# Patient Record
Sex: Male | Born: 1995 | Race: White | Hispanic: No | Marital: Married | Smoking: Former smoker
Health system: Southern US, Community
[De-identification: ages and names within clinical notes are randomized; demographics above are authoritative.]

## PROBLEM LIST (undated history)

## (undated) DIAGNOSIS — I350 Nonrheumatic aortic (valve) stenosis: Secondary | ICD-10-CM

## (undated) DIAGNOSIS — Q249 Congenital malformation of heart, unspecified: Secondary | ICD-10-CM

## (undated) DIAGNOSIS — I7121 Aneurysm of the ascending aorta, without rupture: Secondary | ICD-10-CM

## (undated) DIAGNOSIS — J302 Other seasonal allergic rhinitis: Secondary | ICD-10-CM

## (undated) DIAGNOSIS — Q2381 Bicuspid aortic valve: Secondary | ICD-10-CM

## (undated) DIAGNOSIS — Q248 Other specified congenital malformations of heart: Secondary | ICD-10-CM

## (undated) DIAGNOSIS — Q238 Other congenital malformations of aortic and mitral valves: Secondary | ICD-10-CM

## (undated) HISTORY — PX: OTHER SURGICAL HISTORY: SHX169

## (undated) HISTORY — DX: Other congenital malformations of aortic and mitral valves: Q23.8

## (undated) HISTORY — DX: Bicuspid aortic valve: Q23.81

## (undated) HISTORY — DX: Congenital malformation of heart, unspecified: Q24.9

## (undated) HISTORY — DX: Other seasonal allergic rhinitis: J30.2

## (undated) HISTORY — DX: Other specified congenital malformations of heart: Q24.8

## (undated) HISTORY — DX: Aneurysm of the ascending aorta, without rupture: I71.21

---
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
## 2017-09-26 DIAGNOSIS — Q251 Coarctation of aorta: Secondary | ICD-10-CM | POA: Insufficient documentation

## 2018-12-06 ENCOUNTER — Encounter (HOSPITAL_COMMUNITY): Payer: Self-pay | Admitting: *Deleted

## 2018-12-06 ENCOUNTER — Emergency Department (HOSPITAL_COMMUNITY)
Admission: EM | Admit: 2018-12-06 | Discharge: 2018-12-06 | Disposition: A | Discharge status: Home / Self Care | Payer: TRICARE For Life (TFL) | Attending: Emergency Medicine | Admitting: Emergency Medicine

## 2018-12-06 ENCOUNTER — Inpatient Hospital Stay (HOSPITAL_COMMUNITY): Payer: TRICARE For Life (TFL) | Admitting: Anesthesiology

## 2018-12-06 ENCOUNTER — Observation Stay (HOSPITAL_COMMUNITY)
Admission: EM | Admit: 2018-12-06 | Discharge: 2018-12-07 | Disposition: A | Discharge status: Home / Self Care | Payer: TRICARE For Life (TFL) | Source: Ambulatory Visit | Attending: Orthopedic Surgery | Admitting: Orthopedic Surgery

## 2018-12-06 ENCOUNTER — Encounter (HOSPITAL_COMMUNITY): Payer: Self-pay

## 2018-12-06 ENCOUNTER — Emergency Department (HOSPITAL_COMMUNITY): Payer: TRICARE For Life (TFL)

## 2018-12-06 ENCOUNTER — Encounter (HOSPITAL_COMMUNITY)
Admission: EM | Disposition: A | Discharge status: Home / Self Care | Payer: Self-pay | Source: Ambulatory Visit | Attending: Orthopedic Surgery

## 2018-12-06 ENCOUNTER — Other Ambulatory Visit: Payer: Self-pay

## 2018-12-06 DIAGNOSIS — W540XXA Bitten by dog, initial encounter: Secondary | ICD-10-CM | POA: Insufficient documentation

## 2018-12-06 DIAGNOSIS — Z20828 Contact with and (suspected) exposure to other viral communicable diseases: Secondary | ICD-10-CM | POA: Insufficient documentation

## 2018-12-06 DIAGNOSIS — S62309B Unspecified fracture of unspecified metacarpal bone, initial encounter for open fracture: Secondary | ICD-10-CM

## 2018-12-06 DIAGNOSIS — S62305B Unspecified fracture of fourth metacarpal bone, left hand, initial encounter for open fracture: Principal | ICD-10-CM | POA: Insufficient documentation

## 2018-12-06 DIAGNOSIS — Y998 Other external cause status: Secondary | ICD-10-CM | POA: Insufficient documentation

## 2018-12-06 DIAGNOSIS — Y93K9 Activity, other involving animal care: Secondary | ICD-10-CM | POA: Insufficient documentation

## 2018-12-06 DIAGNOSIS — Z23 Encounter for immunization: Secondary | ICD-10-CM | POA: Insufficient documentation

## 2018-12-06 DIAGNOSIS — Z79899 Other long term (current) drug therapy: Secondary | ICD-10-CM | POA: Insufficient documentation

## 2018-12-06 DIAGNOSIS — Y92018 Other place in single-family (private) house as the place of occurrence of the external cause: Secondary | ICD-10-CM | POA: Insufficient documentation

## 2018-12-06 DIAGNOSIS — F17210 Nicotine dependence, cigarettes, uncomplicated: Secondary | ICD-10-CM | POA: Insufficient documentation

## 2018-12-06 DIAGNOSIS — I350 Nonrheumatic aortic (valve) stenosis: Secondary | ICD-10-CM | POA: Insufficient documentation

## 2018-12-06 HISTORY — PX: I & D EXTREMITY: SHX5045

## 2018-12-06 LAB — SARS CORONAVIRUS 2 BY RT PCR (HOSPITAL ORDER, PERFORMED IN ~~LOC~~ HOSPITAL LAB): SARS Coronavirus 2: NEGATIVE

## 2018-12-06 SURGERY — OPEN REDUCTION INTERNAL FIXATION (ORIF) METACARPAL
Anesthesia: General | Site: Finger | Laterality: Left

## 2018-12-06 SURGERY — IRRIGATION AND DEBRIDEMENT EXTREMITY
Anesthesia: Monitor Anesthesia Care | Site: Hand | Laterality: Left

## 2018-12-06 MED ORDER — OXYCODONE HCL 5 MG PO TABS
10.0000 mg | ORAL_TABLET | ORAL | Status: DC | PRN
  Administered 2018-12-06: 10 mg via ORAL
  Administered 2018-12-07 (×2): 15 mg via ORAL
  Filled 2018-12-06 (×2): qty 3

## 2018-12-06 MED ORDER — LACTATED RINGERS IV SOLN
INTRAVENOUS | Status: DC
  Administered 2018-12-06: 14:00:00 via INTRAVENOUS

## 2018-12-06 MED ORDER — ONDANSETRON HCL 4 MG PO TABS: 4.0000 mg | ORAL_TABLET | Freq: Four times a day (QID) | ORAL | Status: DC | PRN

## 2018-12-06 MED ORDER — ROPIVACAINE HCL 5 MG/ML IJ SOLN
INTRAMUSCULAR | Status: DC | PRN
  Administered 2018-12-06: 30 mL via PERINEURAL

## 2018-12-06 MED ORDER — PROPOFOL 10 MG/ML IV BOLUS
INTRAVENOUS | Status: AC
  Filled 2018-12-06: qty 20

## 2018-12-06 MED ORDER — METHOCARBAMOL 1000 MG/10ML IJ SOLN
500.0000 mg | Freq: Four times a day (QID) | INTRAVENOUS | Status: DC | PRN
  Filled 2018-12-06: qty 5

## 2018-12-06 MED ORDER — VANCOMYCIN HCL 1000 MG IV SOLR
INTRAVENOUS | Status: AC
  Filled 2018-12-06: qty 1000

## 2018-12-06 MED ORDER — LACTATED RINGERS IV SOLN
INTRAVENOUS | Status: DC
  Administered 2018-12-06 – 2018-12-07 (×2): via INTRAVENOUS

## 2018-12-06 MED ORDER — ALPRAZOLAM 0.5 MG PO TABS: 0.5000 mg | ORAL_TABLET | Freq: Four times a day (QID) | ORAL | Status: DC | PRN

## 2018-12-06 MED ORDER — MIDAZOLAM HCL 2 MG/2ML IJ SOLN
INTRAMUSCULAR | Status: DC | PRN
  Administered 2018-12-06: 2 mg via INTRAVENOUS

## 2018-12-06 MED ORDER — 0.9 % SODIUM CHLORIDE (POUR BTL) OPTIME
TOPICAL | Status: DC | PRN
  Administered 2018-12-06: 1000 mL

## 2018-12-06 MED ORDER — FENTANYL CITRATE (PF) 250 MCG/5ML IJ SOLN
INTRAMUSCULAR | Status: DC | PRN
  Administered 2018-12-06: 50 ug via INTRAVENOUS

## 2018-12-06 MED ORDER — MIDAZOLAM HCL 2 MG/2ML IJ SOLN
INTRAMUSCULAR | Status: AC
  Filled 2018-12-06: qty 2

## 2018-12-06 MED ORDER — FENTANYL CITRATE (PF) 100 MCG/2ML IJ SOLN: 25.0000 ug | INTRAMUSCULAR | Status: DC | PRN

## 2018-12-06 MED ORDER — PROMETHAZINE HCL 25 MG/ML IJ SOLN: 6.2500 mg | INTRAMUSCULAR | Status: DC | PRN

## 2018-12-06 MED ORDER — ACETAMINOPHEN 500 MG PO TABS
1000.0000 mg | ORAL_TABLET | Freq: Four times a day (QID) | ORAL | Status: AC
  Administered 2018-12-06 – 2018-12-07 (×4): 1000 mg via ORAL
  Filled 2018-12-06 (×4): qty 2

## 2018-12-06 MED ORDER — FENTANYL CITRATE (PF) 250 MCG/5ML IJ SOLN
INTRAMUSCULAR | Status: AC
  Filled 2018-12-06: qty 5

## 2018-12-06 MED ORDER — LIDOCAINE HCL (CARDIAC) PF 100 MG/5ML IV SOSY
PREFILLED_SYRINGE | INTRAVENOUS | Status: DC | PRN
  Administered 2018-12-06: 60 mg via INTRAVENOUS

## 2018-12-06 MED ORDER — OXYCODONE-ACETAMINOPHEN 5-325 MG PO TABS
1.0000 | ORAL_TABLET | Freq: Once | ORAL | Status: AC
  Administered 2018-12-06: 09:00:00 1 via ORAL
  Filled 2018-12-06: qty 1

## 2018-12-06 MED ORDER — ONDANSETRON HCL 4 MG/2ML IJ SOLN: 4.0000 mg | Freq: Four times a day (QID) | INTRAMUSCULAR | Status: DC | PRN

## 2018-12-06 MED ORDER — ACETAMINOPHEN 325 MG PO TABS: 325.0000 mg | ORAL_TABLET | Freq: Four times a day (QID) | ORAL | Status: DC | PRN

## 2018-12-06 MED ORDER — ONDANSETRON HCL 4 MG/2ML IJ SOLN
INTRAMUSCULAR | Status: DC | PRN
  Administered 2018-12-06: 4 mg via INTRAVENOUS

## 2018-12-06 MED ORDER — HYDROMORPHONE HCL 1 MG/ML IJ SOLN
0.5000 mg | INTRAMUSCULAR | Status: DC | PRN
  Administered 2018-12-06: 1 mg via INTRAVENOUS
  Filled 2018-12-06: qty 1

## 2018-12-06 MED ORDER — METHOCARBAMOL 500 MG PO TABS: 500.0000 mg | ORAL_TABLET | Freq: Four times a day (QID) | ORAL | Status: DC | PRN

## 2018-12-06 MED ORDER — PROMETHAZINE HCL 12.5 MG RE SUPP: 12.5000 mg | Freq: Four times a day (QID) | RECTAL | Status: DC | PRN

## 2018-12-06 MED ORDER — DOCUSATE SODIUM 100 MG PO CAPS
100.0000 mg | ORAL_CAPSULE | Freq: Two times a day (BID) | ORAL | Status: DC
  Administered 2018-12-06: 100 mg via ORAL
  Filled 2018-12-06 (×2): qty 1

## 2018-12-06 MED ORDER — LIDOCAINE-EPINEPHRINE (PF) 2 %-1:200000 IJ SOLN
10.0000 mL | Freq: Once | INTRAMUSCULAR | Status: AC
  Administered 2018-12-06: 10 mL via INTRADERMAL
  Filled 2018-12-06: qty 10

## 2018-12-06 MED ORDER — PROPOFOL 500 MG/50ML IV EMUL
INTRAVENOUS | Status: DC | PRN
  Administered 2018-12-06: 75 ug/kg/min via INTRAVENOUS

## 2018-12-06 MED ORDER — TETANUS-DIPHTH-ACELL PERTUSSIS 5-2.5-18.5 LF-MCG/0.5 IM SUSP
0.5000 mL | Freq: Once | INTRAMUSCULAR | Status: AC
  Administered 2018-12-06: 09:00:00 0.5 mL via INTRAMUSCULAR
  Filled 2018-12-06: qty 0.5

## 2018-12-06 MED ORDER — PROPOFOL 10 MG/ML IV BOLUS
INTRAVENOUS | Status: DC | PRN
  Administered 2018-12-06: 50 mg via INTRAVENOUS
  Administered 2018-12-06 (×3): 20 mg via INTRAVENOUS

## 2018-12-06 MED ORDER — SODIUM CHLORIDE 0.9 % IV SOLN
3.0000 g | Freq: Four times a day (QID) | INTRAVENOUS | Status: DC
  Administered 2018-12-06 – 2018-12-07 (×5): 3 g via INTRAVENOUS
  Filled 2018-12-06 (×2): qty 8
  Filled 2018-12-06: qty 3
  Filled 2018-12-06 (×2): qty 8
  Filled 2018-12-06 (×2): qty 3

## 2018-12-06 MED ORDER — POVIDONE-IODINE 10 % EX SOLN
CUTANEOUS | Status: DC | PRN
  Administered 2018-12-06: 09:00:00 via TOPICAL
  Filled 2018-12-06: qty 15

## 2018-12-06 MED ORDER — SODIUM CHLORIDE 0.9 % IR SOLN
Status: DC | PRN
  Administered 2018-12-06 (×2): 3000 mL

## 2018-12-06 MED ORDER — FAMOTIDINE 20 MG PO TABS: 20.0000 mg | ORAL_TABLET | Freq: Two times a day (BID) | ORAL | Status: DC | PRN

## 2018-12-06 MED ORDER — OXYCODONE HCL 5 MG PO TABS
5.0000 mg | ORAL_TABLET | ORAL | Status: DC | PRN
  Filled 2018-12-06: qty 2

## 2018-12-06 MED ORDER — LIDOCAINE 2% (20 MG/ML) 5 ML SYRINGE
INTRAMUSCULAR | Status: AC
  Filled 2018-12-06: qty 5

## 2018-12-06 MED ORDER — SODIUM CHLORIDE 0.9 % IV SOLN
3.0000 g | Freq: Four times a day (QID) | INTRAVENOUS | Status: DC
  Administered 2018-12-06: 11:00:00 3 g via INTRAVENOUS
  Filled 2018-12-06: qty 8

## 2018-12-06 MED ORDER — VITAMIN C 500 MG PO TABS
1000.0000 mg | ORAL_TABLET | Freq: Every day | ORAL | Status: DC
  Administered 2018-12-06 – 2018-12-07 (×2): 1000 mg via ORAL
  Filled 2018-12-06 (×2): qty 2

## 2018-12-06 SURGICAL SUPPLY — 39 items
BNDG CONFORM 2 STRL LF (GAUZE/BANDAGES/DRESSINGS) ×4 IMPLANT
BNDG CONFORM 3 STRL LF (GAUZE/BANDAGES/DRESSINGS) ×8 IMPLANT
BNDG ELASTIC 3X5.8 VLCR STR LF (GAUZE/BANDAGES/DRESSINGS) ×4 IMPLANT
BNDG ELASTIC 4X5.8 VLCR STR LF (GAUZE/BANDAGES/DRESSINGS) ×4 IMPLANT
BNDG ESMARK 4X9 LF (GAUZE/BANDAGES/DRESSINGS) ×4 IMPLANT
BNDG GAUZE ELAST 4 BULKY (GAUZE/BANDAGES/DRESSINGS) ×4 IMPLANT
COVER SURGICAL LIGHT HANDLE (MISCELLANEOUS) ×4 IMPLANT
CUFF TOURN SGL QUICK 18X4 (TOURNIQUET CUFF) ×4 IMPLANT
DRSG ADAPTIC 3X8 NADH LF (GAUZE/BANDAGES/DRESSINGS) ×4 IMPLANT
GAUZE SPONGE 4X4 12PLY STRL (GAUZE/BANDAGES/DRESSINGS) ×4 IMPLANT
GAUZE XEROFORM 5X9 LF (GAUZE/BANDAGES/DRESSINGS) ×4 IMPLANT
GLOVE SS BIOGEL STRL SZ 8 (GLOVE) ×4 IMPLANT
GLOVE SUPERSENSE BIOGEL SZ 8 (GLOVE) ×4
GOWN STRL REUS W/ TWL XL LVL3 (GOWN DISPOSABLE) ×4 IMPLANT
GOWN STRL REUS W/TWL XL LVL3 (GOWN DISPOSABLE) ×4
JET LAVAGE IRRISEPT WOUND (IRRIGATION / IRRIGATOR) ×4
KIT BASIN OR (CUSTOM PROCEDURE TRAY) ×4 IMPLANT
KIT TURNOVER KIT B (KITS) ×4 IMPLANT
LAVAGE JET IRRISEPT WOUND (IRRIGATION / IRRIGATOR) ×2 IMPLANT
MANIFOLD NEPTUNE II (INSTRUMENTS) ×4 IMPLANT
NS IRRIG 1000ML POUR BTL (IV SOLUTION) ×4 IMPLANT
PACK ORTHO EXTREMITY (CUSTOM PROCEDURE TRAY) ×4 IMPLANT
PAD ARMBOARD 7.5X6 YLW CONV (MISCELLANEOUS) ×8 IMPLANT
PAD CAST 3X4 CTTN HI CHSV (CAST SUPPLIES) ×2 IMPLANT
PAD CAST 4YDX4 CTTN HI CHSV (CAST SUPPLIES) ×2 IMPLANT
PADDING CAST COTTON 3X4 STRL (CAST SUPPLIES) ×2
PADDING CAST COTTON 4X4 STRL (CAST SUPPLIES) ×2
SET IRRIG Y TYPE TUR BLADDER L (SET/KITS/TRAYS/PACK) ×4 IMPLANT
SOL PREP POV-IOD 4OZ 10% (MISCELLANEOUS) ×8 IMPLANT
SPLINT PLASTER EXTRA FAST 3X15 (CAST SUPPLIES) ×4
SPLINT PLASTER GYPS XFAST 3X15 (CAST SUPPLIES) ×4 IMPLANT
SUCTION FRAZIER HANDLE 10FR (MISCELLANEOUS) ×2
SUCTION TUBE FRAZIER 10FR DISP (MISCELLANEOUS) ×2 IMPLANT
SUT PROLENE 4 0 PS 2 18 (SUTURE) ×4 IMPLANT
TOWEL GREEN STERILE (TOWEL DISPOSABLE) ×4 IMPLANT
TOWEL GREEN STERILE FF (TOWEL DISPOSABLE) ×4 IMPLANT
TUBE CONNECTING 12'X1/4 (SUCTIONS) ×1
TUBE CONNECTING 12X1/4 (SUCTIONS) ×3 IMPLANT
WATER STERILE IRR 1000ML POUR (IV SOLUTION) ×4 IMPLANT
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
## 2018-12-06 NOTE — Anesthesia Postprocedure Evaluation (Signed)
Anesthesia Post Note  Patient: Larry Campos  Procedure(s) Performed: IRRIGATION AND DEBRIDEMENT EXTREMITY (Left Hand)     Patient location during evaluation: PACU Anesthesia Type: Regional Level of consciousness: awake and alert Pain management: pain level controlled Vital Signs Assessment: post-procedure vital signs reviewed and stable Respiratory status: spontaneous breathing, nonlabored ventilation, respiratory function stable and patient connected to nasal cannula oxygen Cardiovascular status: stable and blood pressure returned to baseline Postop Assessment: no apparent nausea or vomiting Anesthetic complications: no    Last Vitals:  Vitals:   12/06/18 1823 12/06/18 1915  BP: 126/66 120/65  Pulse: 77 73  Resp: 16 16  Temp: 36.9 C 36.9 C  SpO2: 100% 97%    Last Pain:  Vitals:   12/06/18 2154  TempSrc:   PainSc: [place]

## 2018-12-06 NOTE — Discharge Instructions (Addendum)
Go directly to the ER at [facility] to register.  Nothing to eat or drink.

## 2018-12-06 NOTE — Transfer of Care (Signed)
Immediate Anesthesia Transfer of Care Note  Patient: Larry Campos  Procedure(s) Performed: OPEN REDUCTION INTERNAL FIXATION (ORIF) METACARPAL (Left Finger)  Patient Location: PACU  Anesthesia Type:MAC  Level of Consciousness: awake  Airway & Oxygen Therapy: Patient Spontanous Breathing and Patient connected to nasal cannula oxygen  Post-op Assessment: Report given to RN and Post -op Vital signs reviewed and stable  Post vital signs: Reviewed and stable  Last Vitals:  Vitals Value Taken Time  BP 116/70 12/06/18 1620  Temp    Pulse 60 12/06/18 1622  Resp 10 12/06/18 1622  SpO2 100 % 12/06/18 1622  Vitals shown include unvalidated device data.  Last Pain:  Vitals:   12/06/18 1337  TempSrc: Oral  PainSc: 4       Patients Stated Pain Goal: 4 (48/88/91 6945)  Complications: No apparent anesthesia complications

## 2018-12-06 NOTE — Anesthesia Procedure Notes (Signed)
Anesthesia Regional Block: Axillary brachial plexus block   Pre-Anesthetic Checklist: ,, timeout performed, Correct Patient, Correct Site, Correct Laterality, Correct Procedure, Correct Position, site marked, Risks and benefits discussed,  Surgical consent,  Pre-op evaluation,  At surgeon's request and post-op pain management  Laterality: Left  Prep: chloraprep       Needles:  Injection technique: Single-shot  Needle Type: Echogenic Needle     Needle Length: 9cm  Needle Gauge: 21     Additional Needles:   Procedures:,,,, ultrasound used (permanent image in chart),,,,  Narrative:  Start time: 12/06/2018 2:45 PM End time: 12/06/2018 2:55 PM Injection made incrementally with aspirations every 5 mL.  Performed by: Personally  Anesthesiologist: Catalina Gravel, MD  Additional Notes: No pain on injection. No increased resistance to injection. Injection made in 5cc increments.  Good needle visualization.  Patient tolerated procedure well.

## 2018-12-06 NOTE — ED Notes (Signed)
Dressed left hand with bulky dressing.
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
## 2018-12-06 NOTE — ED Notes (Signed)
Dr. Amedeo Plenty is in surgery.  Nurse will have him call Tammy when available.

## 2018-12-06 NOTE — ED Notes (Signed)
Pt bite by his dog, dog has had rabies shots.  Bleeding noted to left hand, dressing and pressure applied to area.

## 2018-12-06 NOTE — ED Notes (Signed)
Report Given to Noble, Therapist, sports.  Per Heath Lark, pt is to go directly from [facility] to [facility] ED registration and register for short stay.  Pt verbalized understanding.

## 2018-12-06 NOTE — ED Notes (Signed)
Animal control called, Officer to come out.
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
## 2018-12-06 NOTE — Anesthesia Preprocedure Evaluation (Addendum)
Anesthesia Evaluation  Patient identified by MRN, date of birth, ID band Patient awake    Reviewed: Allergy & Precautions, NPO status , Patient's Chart, lab work & pertinent test results  Airway Mallampati: II  TM Distance: >3 FB Neck ROM: Full    Dental  (+) Teeth Intact, Dental Advisory Given   Pulmonary Current SmokerPatient did not abstain from smoking.,    Pulmonary exam normal breath sounds clear to auscultation       Cardiovascular + Valvular Problems/Murmurs AS and AI  Rhythm:Regular Rate:Normal + Systolic murmurs and + Diastolic murmurs OSH Cardiology Note 2012: History of bicuspid AV, mild AS, mild-moderate AI. Aortic root was dilated.   Neuro/Psych negative neurological ROS     GI/Hepatic negative GI ROS, Neg liver ROS,   Endo/Other  negative endocrine ROS  Renal/GU negative Renal ROS     Musculoskeletal fracture of left 4th finger   Abdominal   Peds  Hematology negative hematology ROS (+)   Anesthesia Other Findings Day of surgery medications reviewed with the patient.  Reproductive/Obstetrics                            Anesthesia Physical Anesthesia Plan  ASA: II  Anesthesia Plan: Regional and MAC   Post-op Pain Management:    Induction: Intravenous  PONV Risk Score and Plan: 1 and Propofol infusion and Treatment may vary due to age or medical condition  Airway Management Planned: Nasal Cannula and Natural Airway  Additional Equipment:   Intra-op Plan:   Post-operative Plan:   Informed Consent: I have reviewed the patients History and Physical, chart, labs and discussed the procedure including the risks, benefits and alternatives for the proposed anesthesia with the patient or authorized representative who has indicated his/her understanding and acceptance.     Dental advisory given  Plan Discussed with:   Anesthesia Plan Comments:         Anesthesia  Quick Evaluation
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
## 2018-12-06 NOTE — ED Notes (Addendum)
Per Tammy, OK to wrap IV and leave in [facility].

## 2018-12-07 ENCOUNTER — Encounter (HOSPITAL_COMMUNITY): Payer: Self-pay | Admitting: Orthopedic Surgery

## 2018-12-07 LAB — CBC
HCT: 37.2 % — ABNORMAL LOW (ref 39.0–52.0)
Hemoglobin: 13 g/dL (ref 13.0–17.0)
MCH: 30.8 pg (ref 26.0–34.0)
MCHC: 34.9 g/dL (ref 30.0–36.0)
MCV: 88.2 fL (ref 80.0–100.0)
Platelets: 170 10*3/uL (ref 150–400)
RBC: 4.22 MIL/uL (ref 4.22–5.81)
RDW: 11.9 % (ref 11.5–15.5)
WBC: 9 10*3/uL (ref 4.0–10.5)
nRBC: 0 % (ref 0.0–0.2)

## 2018-12-07 MED ORDER — DIPHENHYDRAMINE HCL 25 MG PO CAPS
25.0000 mg | ORAL_CAPSULE | Freq: Three times a day (TID) | ORAL | Status: DC | PRN
  Administered 2018-12-07: 25 mg via ORAL
  Filled 2018-12-07: qty 1

## 2018-12-07 MED ORDER — AMOXICILLIN-POT CLAVULANATE 875-125 MG PO TABS: 1.0000 | ORAL_TABLET | Freq: Two times a day (BID) | ORAL | 0 refills | Status: DC

## 2018-12-07 MED ORDER — HYDROMORPHONE HCL 1 MG/ML IJ SOLN
1.0000 mg | INTRAMUSCULAR | Status: DC | PRN
  Administered 2018-12-07: 1 mg via INTRAVENOUS
  Filled 2018-12-07: qty 1

## 2018-12-07 MED ORDER — METHOCARBAMOL 500 MG PO TABS: 500.0000 mg | ORAL_TABLET | Freq: Four times a day (QID) | ORAL | 0 refills | Status: DC | PRN

## 2018-12-07 MED ORDER — ONDANSETRON HCL 4 MG PO TABS: 4.0000 mg | ORAL_TABLET | Freq: Four times a day (QID) | ORAL | 0 refills | Status: DC | PRN

## 2018-12-07 MED ORDER — INFLUENZA VAC SPLIT QUAD 0.5 ML IM SUSY: 0.5000 mL | PREFILLED_SYRINGE | INTRAMUSCULAR | Status: DC

## 2018-12-07 MED ORDER — OXYCODONE HCL 5 MG PO TABS: 5.0000 mg | ORAL_TABLET | ORAL | 0 refills | Status: DC | PRN

## 2018-12-07 NOTE — Progress Notes (Signed)
Orthopedic Tech Progress Note Patient Details:  Larry Campos 07-18-1995 [id number] Patient is up for discharge later and RN is requesting arm sling Ortho Devices Type of Ortho Device: Arm sling Ortho Device/Splint Location: ULE Ortho Device/Splint Interventions: Adjustment, Application, Ordered   Post Interventions Patient Tolerated: Well Instructions Provided: Care of device, Adjustment of device   Janit Pagan 12/07/2018, 11:28 AM

## 2018-12-07 NOTE — Progress Notes (Signed)
Patient ID: Larry Campos, male   DOB: [birth year], 23 y.o.   MRN: [id number] Patient I had a long conversation at bedside regarding all issues.  We will plan for DC today.  He is awake alert and oriented and stable.  No complicating features.  Final diagnosis status post irrigation debridement open canine bite with associated open fourth metacarpal fracture left hand.  It has been a pleasure seeing him today.  We went through all issues.  He will be DC'd after his afternoon dose of Unasyn.  Yarethzi Branan MD

## 2018-12-07 NOTE — Progress Notes (Signed)
Afternoon dose of Unasyn IV given; Discharge instructions addressed; Pt in stable condition; not in respiratory distress.Pt to be picked up by family friend at [employer] entrance.

## 2018-12-07 NOTE — Progress Notes (Signed)
Pt stated that they got their prescribed meds from [facility]. through a family member.
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
## 2018-12-07 NOTE — Discharge Instructions (Signed)

## 2018-12-10 DIAGNOSIS — S61459A Open bite of unspecified hand, initial encounter: Secondary | ICD-10-CM | POA: Insufficient documentation

## 2018-12-10 DIAGNOSIS — S62305B Unspecified fracture of fourth metacarpal bone, left hand, initial encounter for open fracture: Secondary | ICD-10-CM | POA: Insufficient documentation

## 2019-07-15 DIAGNOSIS — Z6822 Body mass index (BMI) 22.0-22.9, adult: Secondary | ICD-10-CM | POA: Diagnosis not present

## 2019-07-15 DIAGNOSIS — Z13220 Encounter for screening for lipoid disorders: Secondary | ICD-10-CM | POA: Diagnosis not present

## 2019-07-15 DIAGNOSIS — J309 Allergic rhinitis, unspecified: Secondary | ICD-10-CM | POA: Diagnosis not present

## 2019-07-15 DIAGNOSIS — Q230 Congenital stenosis of aortic valve: Secondary | ICD-10-CM | POA: Diagnosis not present

## 2019-08-18 ENCOUNTER — Ambulatory Visit: Payer: Self-pay | Admitting: Cardiovascular Disease

## 2019-08-20 DIAGNOSIS — Q230 Congenital stenosis of aortic valve: Secondary | ICD-10-CM | POA: Diagnosis not present

## 2019-09-11 ENCOUNTER — Encounter: Payer: Self-pay | Admitting: *Deleted

## 2019-09-14 ENCOUNTER — Encounter: Payer: Self-pay | Admitting: *Deleted

## 2019-09-14 ENCOUNTER — Encounter: Payer: Self-pay | Admitting: Cardiology

## 2019-09-14 ENCOUNTER — Ambulatory Visit (INDEPENDENT_AMBULATORY_CARE_PROVIDER_SITE_OTHER): Payer: Self-pay | Admitting: Cardiology

## 2019-09-14 VITALS — BP 110/60 | HR 69 | Ht 68.0 in | Wt 129.0 lb

## 2019-09-14 DIAGNOSIS — I351 Nonrheumatic aortic (valve) insufficiency: Secondary | ICD-10-CM

## 2019-09-14 DIAGNOSIS — Z136 Encounter for screening for cardiovascular disorders: Secondary | ICD-10-CM

## 2019-09-14 DIAGNOSIS — Q231 Congenital insufficiency of aortic valve: Secondary | ICD-10-CM

## 2019-09-14 DIAGNOSIS — I350 Nonrheumatic aortic (valve) stenosis: Secondary | ICD-10-CM

## 2019-09-14 NOTE — Patient Instructions (Signed)
Medication Instructions:  Continue all current medications.  Labwork: none  Testing/Procedures:  Your physician has requested that you have an echocardiogram. Echocardiography is a painless test that uses sound waves to create images of your heart. It provides your doctor with information about the size and shape of your heart and how well your heart's chambers and valves are working. This procedure takes approximately one hour. There are no restrictions for this procedure - due in 6 months.   Follow-Up: 6 months   Any Other Special Instructions Will Be Listed Below (If Applicable).  If you need a refill on your cardiac medications before your next appointment, please call your pharmacy.

## 2019-09-14 NOTE — Progress Notes (Signed)
Cardiology Office Note  Date: 09/14/2019   ID: ALECXANDER MAINWARING, DOB 19-Jun-1995, MRN [id number]  PCP:  Selinda Flavin, MD  Cardiologist:  Nona Dell, MD Electrophysiologist:  None   Chief Complaint  Patient presents with  . Abnormal aortic valve    History of Present Illness: JOAQUIN KNEBEL is a 24 y.o. male referred for cardiology consultation by Ms. Boles PA-C with [facility] to establish follow-up of aortic valve disease.  He has a congenitally abnormal aortic valve based on chart review. Patient followed in the past with pediatric cardiology at [facility] with reported history of bicuspid aortic valve, as of 2012 mild to moderate aortic regurgitation and associated dilatation of the ascending aorta.  He was last seen by Dr. Rhetta Mura in 2012.  He works at [employer]  States that he has 4 dogs that he walks regularly.  He does not report any limiting dyspnea on exertion, no chest pain, palpitations, or syncope.  Recent echocardiogram done at [facility] is outlined below.  LVEF described as greater than 55%, aortic valve likely bicuspid although with question of unicuspid valve and also at least moderate eccentric aortic regurgitation.  I personally reviewed his ECG today which shows sinus arrhythmia, normal.  He reports no family history of congenital heart disease.   Past Medical History:  Diagnosis Date  . Bicuspid aortic valve   . Seasonal allergies     Past Surgical History:  Procedure Laterality Date  . I & D EXTREMITY Left 12/06/2018   Procedure: IRRIGATION AND DEBRIDEMENT EXTREMITY;  Surgeon: Dominica Severin, MD;  Location: [facility] OR;  Service: Orthopedics;  Laterality: Left;  . TONSILLECTOMY AND ADENOIDECTOMY  12/07/2010   Procedure: TONSILLECTOMY AND ADENOIDECTOMY;  Surgeon: Darletta Moll;  Location: [facility] ORS;  Service: ENT;  Laterality: Bilateral;    Current Outpatient Medications  Medication Sig Dispense Refill  . cetirizine (ZYRTEC) 10 MG tablet Take 10 mg by  mouth daily.    Marland Kitchen ibuprofen (ADVIL) 200 MG tablet Take 400 mg by mouth every 6 (six) hours as needed for mild pain.    . montelukast (SINGULAIR) 10 MG tablet Take 10 mg by mouth at bedtime.     No current facility-administered medications for this visit.   Allergies:  Penicillins   Social History: The patient  reports that he has never smoked. He has never used smokeless tobacco. He reports current alcohol use. He reports that he does not use drugs.   Family History: The patient's family history includes Healthy in his father and mother.   ROS:  Please see the history of present illness. Otherwise, complete review of systems is positive for none.  All other systems are reviewed and negative.   Physical Exam: VS:  BP 110/60   Pulse 69   Ht 5\' 8"  (1.727 m)   Wt 129 lb (58.5 kg)   SpO2 99%   BMI 19.61 kg/m , BMI Body mass index is 19.61 kg/m.  Wt Readings from Last 3 Encounters:  09/14/19 129 lb (58.5 kg)  12/06/18 135 lb (61.2 kg)  12/01/10 68 lb (30.8 kg) (<1 %, Z= -2.70)*   * Growth percentiles are based on CDC (Boys, 2-20 Years) data.    General: Patient appears comfortable at rest. HEENT: Conjunctiva and lids normal, wearing a mask. Neck: Supple, no elevated JVP or carotid bruits, no thyromegaly. Lungs: Clear to auscultation, nonlabored breathing at rest. Cardiac: Regular rate and rhythm, no S3, 4/6 systolic murmur with 3/6 diastolic murmur, no pericardial  rub. Abdomen: Soft, nontender, bowel sounds present, no guarding or rebound. Extremities: No pitting edema, distal pulses 2+. Skin: Warm and dry. Musculoskeletal: No kyphosis. Neuropsychiatric: Alert and oriented x3, affect grossly appropriate.  ECG:  No old tracing available for review today.  Recent Labwork: 12/07/2018: Hemoglobin 13.0; Platelets 170  May 2021: Hemoglobin 13.0, platelets 150, cholesterol 124, triglycerides 110, HDL 40, LDL 64, BUN 17, creatinine 1.02, potassium 4.4, AST 23, ALT 11  Other Studies  Reviewed Today:  Echocardiogram 08/20/2019 [facility]): Left Ventricle  The left ventricle is upper normal in size with normal wall thickness.  The left ventricular systolic function is overall normal, LVEF is visually  estimated at > 55%.  There is normal left ventricular diastolic function.   Right Ventricle  The right ventricle is normal in size, with normal systolic function.   Left Atrium  The left atrium is normal in size.   Right Atrium  The right atrium is normal in size.   Aortic Valve  Most likely bicuspid valve (small commissure), however in 1 short axis view,  could argue to be unicuspid. Doming present. At least moderate but possibly  severe aortic regurgitation present (due to eccentric jet towards anterior  leaflet of mitral valve), no prior study available for comparison.   Pulmonic Valve  The pulmonic valve is poorly visualized, but probably normal.  There is no significant pulmonic regurgitation.  There is no evidence of a significant transvalvular gradient.   Mitral Valve  The mitral valve leaflets are normal with normal leaflet mobility.  There is no significant mitral valve regurgitation.   Tricuspid Valve  The tricuspid valve leaflets are normal, with normal leaflet mobility.  There is trivial tricuspid regurgitation.  Pulmonary systolic pressure cannot be estimated due to insufficient TR jet.   Other Findings  Rhythm: Sinus Rhythm.   Pericardium/Pleural  There is no pericardial effusion.   Inferior Vena Cava  The IVC is suboptimally visualized but probably suggests normal right atrial  pressure.   Aorta  The aorta at the ascending aorta is mildly dilated, see below.  Aortic root 3.6 - 3.8 cm. Ascending aorta 4.0 cm where seen.   Assessment and Plan:  History of congenitally abnormal aortic valve, reportedly bicuspid although with question of unicuspid valve raised based on most recent  echocardiogram report at [facility].  Peak AV velocity described at 4.1 m/s with mean gradient 38 mmHg in the setting of at least moderate eccentric aortic regurgitation and mildly dilated ascending aorta.  Although study report does not specifically indicate this, it would seem that the patient has evidence of moderate to severe aortic stenosis with at least moderate eccentric aortic regurgitation.  Based on that I would suspect that this could actually be a unicuspid valve since he is only 24 years old and typically a bicuspid valve would present clinically in the sixth decade.  I reviewed his ECG which is normal.  He does not report any obvious exertional symptoms at this time.  No clear indication for SBE prophylaxis based on current guidelines.  We will bring him back to the office in 6 months for repeat echocardiogram, may need to pursue functional testing at that time as well depending on results.  I did talk with him about the progressive nature of this type of valve condition and the fact that he will need to have surgical repair ultimately.  Medication Adjustments/Labs and Tests Ordered: Current medicines are reviewed at length with the patient today.  Concerns regarding  medicines are outlined above.   Tests Ordered: Orders Placed This Encounter  Procedures  . EKG 12-Lead  . ECHOCARDIOGRAM COMPLETE    Medication Changes: No orders of the defined types were placed in this encounter.   Disposition:  Follow up 6 months in the [place] office.  Signed, Jonelle Sidle, MD, [facility] 09/14/2019 9:26 AM    [facility] at [facility] [address] [place], [place], [place] [address] Phone: [phone]; Fax: [phone]

## 2020-03-03 DIAGNOSIS — J069 Acute upper respiratory infection, unspecified: Secondary | ICD-10-CM | POA: Diagnosis not present

## 2020-03-03 DIAGNOSIS — Z20828 Contact with and (suspected) exposure to other viral communicable diseases: Secondary | ICD-10-CM | POA: Diagnosis not present

## 2020-03-16 ENCOUNTER — Other Ambulatory Visit: Payer: Self-pay

## 2020-04-13 ENCOUNTER — Ambulatory Visit (INDEPENDENT_AMBULATORY_CARE_PROVIDER_SITE_OTHER): Payer: BC Managed Care – PPO

## 2020-04-13 ENCOUNTER — Encounter: Payer: Self-pay | Admitting: Cardiology

## 2020-04-13 DIAGNOSIS — I351 Nonrheumatic aortic (valve) insufficiency: Secondary | ICD-10-CM | POA: Diagnosis not present

## 2020-04-13 DIAGNOSIS — I350 Nonrheumatic aortic (valve) stenosis: Secondary | ICD-10-CM

## 2020-04-13 LAB — ECHOCARDIOGRAM COMPLETE
AR max vel: 1.16 cm2
AV Area VTI: 1.23 cm2
AV Area mean vel: 1.11 cm2
AV Mean grad: 23.4 mmHg
AV Peak grad: 41 mmHg
Ao pk vel: 3.2 m/s
Area-P 1/2: 2.16 cm2
Calc EF: 61.6 %
P 1/2 time: 743 msec
S' Lateral: 3.53 cm
Single Plane A2C EF: 64 %
Single Plane A4C EF: 61.7 %

## 2020-04-14 ENCOUNTER — Telehealth: Payer: Self-pay | Admitting: *Deleted

## 2020-04-14 NOTE — Telephone Encounter (Signed)
Patient informed. Copy sent to PCP °

## 2020-04-14 NOTE — Telephone Encounter (Signed)
-----   Message from Jonelle Sidle, MD sent at 04/13/2020  4:38 PM EST ----- Results reviewed.  Report reviewed as well as study images.  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  Based on valve gradients there is moderate aortic stenosis, also moderate aortic regurgitation.  Plan to keep office follow-up as scheduled.  Will reevaluate functional capacity and any potential symptoms, most likely can continue to follow this for now however.

## 2020-05-17 ENCOUNTER — Ambulatory Visit (INDEPENDENT_AMBULATORY_CARE_PROVIDER_SITE_OTHER): Payer: BC Managed Care – PPO | Admitting: Cardiology

## 2020-05-17 ENCOUNTER — Encounter: Payer: Self-pay | Admitting: Cardiology

## 2020-05-17 VITALS — BP 108/62 | HR 69 | Wt 133.2 lb

## 2020-05-17 DIAGNOSIS — Q248 Other specified congenital malformations of heart: Secondary | ICD-10-CM

## 2020-05-17 NOTE — Progress Notes (Signed)
Cardiology Office Note  Date: 05/17/2020   ID: Larry Risdon., DOB 1995/09/02, MRN [id number]  PCP:  Selinda Flavin, MD  Cardiologist:  Nona Dell, MD Electrophysiologist:  None   Chief Complaint  Patient presents with  . Cardiac follow-up    History of Present Illness: Larry Vickrey. is a 25 y.o. male seen in consultation back in July 2021.  He presents for a routine follow-up visit.  Continues to do very well, no change in stamina, no exertional chest pain, progressive dyspnea on exertion, or syncope.  Still works for [employer]  He has a 34-month-old son at home, states that things are going very well.  Follow-up echocardiogram in February showed LVEF 55 to 60%, unicuspid aortic valve with moderate aortic regurgitation and moderate aortic stenosis, mean gradient 23 mmHg.  There was also mild dilatation of the ascending aorta at 38 mm.  We discussed the results of his recent testing.  Plan is to continue observation at this point with reimaging in 1 year presuming he develops no progressive symptoms.  I have talked with him about eventual valve replacement however.  Past Medical History:  Diagnosis Date  . Seasonal allergies   . Unicuspid aortic valve     Past Surgical History:  Procedure Laterality Date  . I & D EXTREMITY Left 12/06/2018   Procedure: IRRIGATION AND DEBRIDEMENT EXTREMITY;  Surgeon: Dominica Severin, MD;  Location: [facility] OR;  Service: Orthopedics;  Laterality: Left;  . TONSILLECTOMY AND ADENOIDECTOMY  12/07/2010   Procedure: TONSILLECTOMY AND ADENOIDECTOMY;  Surgeon: Darletta Moll;  Location: [facility] ORS;  Service: ENT;  Laterality: Bilateral;    Current Outpatient Medications  Medication Sig Dispense Refill  . cetirizine (ZYRTEC) 10 MG tablet Take 10 mg by mouth daily.     No current facility-administered medications for this visit.   Allergies:  Penicillins   ROS: No syncope.  Physical Exam: VS:  BP 108/62 (BP Location: Right Arm, Patient Position:  Sitting, Cuff Size: Normal)   Pulse 69   Wt 133 lb 3.2 oz (60.4 kg)   SpO2 99%   BMI 20.25 kg/m , BMI Body mass index is 20.25 kg/m.  Wt Readings from Last 3 Encounters:  05/17/20 133 lb 3.2 oz (60.4 kg)  09/14/19 129 lb (58.5 kg)  12/06/18 135 lb (61.2 kg)    General: Patient appears comfortable at rest. HEENT: Conjunctiva and lids normal, wearing a mask. Neck: Supple, no elevated JVP or carotid bruits, no thyromegaly. Lungs: Clear to auscultation, nonlabored breathing at rest. Cardiac: Regular rate and rhythm, no S3, 4/6 systolic murmur and 3/6 diastolic murmur, no pericardial rub. Abdomen: Soft, nontender, bowel sounds present. Extremities: No pitting edema, distal pulses 2+.  ECG:  An ECG dated 09/14/2019 was personally reviewed today and demonstrated:  Sinus arrhythmia.  Recent Labwork:  May 2021: Hemoglobin 14.6, platelets 213, cholesterol 124, triglycerides 110, HDL 40, LDL 64, BUN 17, creatinine 1.02, potassium 4.4, AST 23, ALT 11  Other Studies Reviewed Today:  Echocardiogram 04/13/2020: 1. Left ventricular ejection fraction, by estimation, is 55 to 60%. The  left ventricle has normal function. The left ventricle has no regional  wall motion abnormalities. Left ventricular diastolic parameters were  normal. The average left ventricular  global longitudinal strain is -21.0 %. The global longitudinal strain is  normal.  2. Right ventricular systolic function is normal. The right ventricular  size is normal.  3. The mitral valve is normal in structure. No evidence of mitral valve  regurgitation. No evidence of mitral stenosis.  4. Aortic valve appears to be unicuspid. . The aortic valve is abnormal.  Aortic valve regurgitation is moderate. Moderate aortic valve stenosis.  Aortic valve mean gradient measures 23.4 mmHg. Aortic valve peak gradient  measures 41.0 mmHg. Aortic valve  area, by VTI measures 1.23 cm.  5. Aortic dilatation noted. There is mild dilatation  of the ascending  aorta, measuring 38 mm.  6. The inferior vena cava is normal in size with greater than 50%  respiratory variability, suggesting right atrial pressure of 3 mmHg.   Comparison(s): Echocardiogram done 08/20/19 showed an EF of > 55% with  moderate AI and a AV Mean Grad of 38 mm Hg.   Assessment and Plan:  Congenital unicuspid aortic valve, at this point with evidence of moderate aortic stenosis and moderate aortic regurgitation, also mildly dilated ascending aorta.  He is asymptomatic and reports good exercise tolerance.  Plan to observe for now with repeat clinical assessment and echocardiogram in 1 year, sooner if symptoms intervene.  Medication Adjustments/Labs and Tests Ordered: Current medicines are reviewed at length with the patient today.  Concerns regarding medicines are outlined above.   Tests Ordered: Orders Placed This Encounter  Procedures  . ECHOCARDIOGRAM COMPLETE    Medication Changes: No orders of the defined types were placed in this encounter.   Disposition:  Follow up 1 year in the [place] office.  Signed, Jonelle Sidle, MD, [facility] 05/17/2020 2:23 PM    [facility] at [facility] [address] [place], [place], [place] [address] Phone: [phone]; Fax: [phone]

## 2020-05-17 NOTE — Patient Instructions (Signed)
Medication Instructions:   Your physician recommends that you continue on your current medications as directed. Please refer to the Current Medication list given to you today.  Labwork:  none  Testing/Procedures: Your physician has requested that you have an echocardiogram in 1 year just before your next visit. Echocardiography is a painless test that uses sound waves to create images of your heart. It provides your doctor with information about the size and shape of your heart and how well your heart's chambers and valves are working. This procedure takes approximately one hour. There are no restrictions for this procedure.  Follow-Up:  Your physician recommends that you schedule a follow-up appointment in: 1 year. You will receive a reminder letter in the mail in about 10 months reminding you to call and schedule your appointment. If you don't receive this letter, please contact our office.  Any Other Special Instructions Will Be Listed Below (If Applicable).  If you need a refill on your cardiac medications before your next appointment, please call your pharmacy.

## 2020-07-11 DIAGNOSIS — Z20828 Contact with and (suspected) exposure to other viral communicable diseases: Secondary | ICD-10-CM | POA: Diagnosis not present

## 2020-07-11 DIAGNOSIS — Q230 Congenital stenosis of aortic valve: Secondary | ICD-10-CM | POA: Diagnosis not present

## 2020-07-11 DIAGNOSIS — J029 Acute pharyngitis, unspecified: Secondary | ICD-10-CM | POA: Diagnosis not present

## 2021-01-27 DIAGNOSIS — J069 Acute upper respiratory infection, unspecified: Secondary | ICD-10-CM | POA: Diagnosis not present

## 2021-01-27 DIAGNOSIS — Z20828 Contact with and (suspected) exposure to other viral communicable diseases: Secondary | ICD-10-CM | POA: Diagnosis not present

## 2021-05-10 ENCOUNTER — Other Ambulatory Visit: Payer: BC Managed Care – PPO

## 2021-05-25 ENCOUNTER — Ambulatory Visit (INDEPENDENT_AMBULATORY_CARE_PROVIDER_SITE_OTHER): Payer: BC Managed Care – PPO

## 2021-05-25 DIAGNOSIS — Q248 Other specified congenital malformations of heart: Secondary | ICD-10-CM | POA: Diagnosis not present

## 2021-05-26 LAB — ECHOCARDIOGRAM COMPLETE
AR max vel: 1.1 cm2
AV Area VTI: 1.32 cm2
AV Area mean vel: 1.16 cm2
AV Mean grad: 26.8 mmHg
AV Peak grad: 49.1 mmHg
AV Vena cont: 0.66 cm
Ao pk vel: 3.5 m/s
Area-P 1/2: 3.4 cm2
Calc EF: 66.8 %
P 1/2 time: 380 msec
S' Lateral: 3.56 cm
Single Plane A2C EF: 71.8 %
Single Plane A4C EF: 60.2 %

## 2021-07-06 ENCOUNTER — Emergency Department (HOSPITAL_COMMUNITY): Payer: BC Managed Care – PPO

## 2021-07-06 ENCOUNTER — Encounter (HOSPITAL_COMMUNITY): Payer: Self-pay | Admitting: *Deleted

## 2021-07-06 ENCOUNTER — Other Ambulatory Visit: Payer: Self-pay

## 2021-07-06 ENCOUNTER — Emergency Department (HOSPITAL_COMMUNITY)
Admission: EM | Admit: 2021-07-06 | Discharge: 2021-07-06 | Disposition: A | Discharge status: Home / Self Care | Payer: BC Managed Care – PPO | Attending: Emergency Medicine | Admitting: Emergency Medicine

## 2021-07-06 DIAGNOSIS — R55 Syncope and collapse: Secondary | ICD-10-CM | POA: Insufficient documentation

## 2021-07-06 DIAGNOSIS — R079 Chest pain, unspecified: Secondary | ICD-10-CM | POA: Insufficient documentation

## 2021-07-06 DIAGNOSIS — I10 Essential (primary) hypertension: Secondary | ICD-10-CM | POA: Diagnosis not present

## 2021-07-06 DIAGNOSIS — Z6822 Body mass index (BMI) 22.0-22.9, adult: Secondary | ICD-10-CM | POA: Diagnosis not present

## 2021-07-06 DIAGNOSIS — R42 Dizziness and giddiness: Secondary | ICD-10-CM | POA: Diagnosis not present

## 2021-07-06 DIAGNOSIS — I350 Nonrheumatic aortic (valve) stenosis: Secondary | ICD-10-CM | POA: Diagnosis not present

## 2021-07-06 DIAGNOSIS — R011 Cardiac murmur, unspecified: Secondary | ICD-10-CM | POA: Diagnosis not present

## 2021-07-06 LAB — BASIC METABOLIC PANEL
Anion gap: 6 (ref 5–15)
BUN: 13 mg/dL (ref 6–20)
CO2: 26 mmol/L (ref 22–32)
Calcium: 8.9 mg/dL (ref 8.9–10.3)
Chloride: 107 mmol/L (ref 98–111)
Creatinine, Ser: 0.97 mg/dL (ref 0.61–1.24)
GFR, Estimated: >60 mL/min (ref >60)
Glucose, Bld: 106 mg/dL — ABNORMAL HIGH (ref 70–99)
Potassium: 3.7 mmol/L (ref 3.5–5.1)
Sodium: 139 mmol/L (ref 135–145)

## 2021-07-06 LAB — CBC
HCT: 41.4 % (ref 39.0–52.0)
Hemoglobin: 14 g/dL (ref 13.0–17.0)
MCH: 30.7 pg (ref 26.0–34.0)
MCHC: 33.8 g/dL (ref 30.0–36.0)
MCV: 90.8 fL (ref 80.0–100.0)
Platelets: 207 10*3/uL (ref 150–400)
RBC: 4.56 MIL/uL (ref 4.22–5.81)
RDW: 12.2 % (ref 11.5–15.5)
WBC: 6.7 10*3/uL (ref 4.0–10.5)
nRBC: 0 % (ref 0.0–0.2)

## 2021-07-06 LAB — D-DIMER, QUANTITATIVE: D-Dimer, Quant: <0.27 ug/mL-FEU (ref 0.00–0.50)

## 2021-07-06 LAB — TROPONIN I (HIGH SENSITIVITY)
Troponin I (High Sensitivity): 2 ng/L (ref <18)
Troponin I (High Sensitivity): 2 ng/L (ref <18)

## 2021-07-06 LAB — CBG MONITORING, ED: Glucose-Capillary: 87 mg/dL (ref 70–99)

## 2021-07-06 NOTE — Discharge Instructions (Signed)
Call your cardiologist tomorrow let them know you were seen in ED.  Return to the ED if you have another syncopal episode or if you have any recurrent chest pain.  Do not overexert yourself, drink plenty of fluids and rest.  If your symptoms return to the ED for evaluation. ?

## 2021-07-06 NOTE — ED Provider Notes (Signed)
?[facility]ER[facility] DEPARTMENT ?Provider Note ? ? ?CSN: [id number] ?Arrival date & time: 07/06/21  1605 ? ?  ? ?History ? ?Chief Complaint  ?Patient presents with  ? Near Syncope  ? ? ?Larry Brash. is a 26 y.o. male. ? ? ?Near Syncope ? ? ?Patient with history of unicuspid aortic valve presents today due to presyncopal event.  Patient was at work, he felt lightheaded as if the room was spinning.  He felt left-sided chest pain which was sharp and did not radiate and lasted for about 5 minutes.  He sat down and the feeling passed but he was worried and came to the ED for evaluation.  No history of seizures, does not do any drugs or drink any alcohol.  Denies any recent changes in medicine, currently asymptomatic. ? ?Home Medications ?Prior to Admission medications   ?Medication Sig Start Date End Date Taking? Authorizing Provider  ?cetirizine (ZYRTEC) 10 MG tablet Take 10 mg by mouth daily.   Yes [provider]  ?   ? ?Allergies    ?Amoxicillin and Penicillins   ? ?Review of Systems   ?Review of Systems  ?Cardiovascular:  Positive for near-syncope.  ? ?Physical Exam ?Updated Vital Signs ?BP 117/68   Pulse 66   Temp 98.1 ?F (36.7 ?C) (Oral)   Resp 15   Ht 5\' 9"  (1.753 m)   Wt 59.9 kg   SpO2 100%   BMI 19.49 kg/m?  ?Physical Exam ?Vitals and nursing note reviewed. Exam conducted with a chaperone present.  ?Constitutional:   ?   Appearance: Normal appearance.  ?HENT:  ?   Head: Normocephalic and atraumatic.  ?Eyes:  ?   General: No scleral icterus.    ?   Right eye: No discharge.     ?   Left eye: No discharge.  ?   Extraocular Movements: Extraocular movements intact.  ?   Pupils: Pupils are equal, round, and reactive to light.  ?Cardiovascular:  ?   Rate and Rhythm: Normal rate and regular rhythm.  ?   Pulses: Normal pulses.  ?   Heart sounds: Murmur heard.  ?  No friction rub. No gallop.  ?Pulmonary:  ?   Effort: Pulmonary effort is normal. No respiratory distress.  ?   Breath sounds: Normal  breath sounds.  ?Abdominal:  ?   General: Abdomen is flat. Bowel sounds are normal. There is no distension.  ?   Palpations: Abdomen is soft.  ?   Tenderness: There is no abdominal tenderness.  ?Skin: ?   General: Skin is warm and dry.  ?   Coloration: Skin is not jaundiced.  ?Neurological:  ?   Mental Status: He is alert. Mental status is at baseline.  ?   Coordination: Coordination normal.  ?   Comments: Cranial nerves III through XII are grossly intact, grip strength is equal bilaterally.  ? ? ?ED Results / Procedures / Treatments   ?Labs ?(all labs ordered are listed, but only abnormal results are displayed) ?Labs Reviewed  ?BASIC METABOLIC PANEL - Abnormal; Notable for the following components:  ?    Result Value  ? Glucose, Bld 106 (*)   ? All other components within normal limits  ?CBC  ?D-DIMER, QUANTITATIVE  ?URINALYSIS, ROUTINE W REFLEX MICROSCOPIC  ?CBG MONITORING, ED  ?TROPONIN I (HIGH SENSITIVITY)  ?TROPONIN I (HIGH SENSITIVITY)  ? ? ?EKG ?None ? ?Radiology ?DG Chest 2 View ? ?Result Date: 07/06/2021 ?CLINICAL DATA:  Chest pain.  Syncopal  episode today EXAM: CHEST - 2 VIEW COMPARISON:  None Available. FINDINGS: The cardiomediastinal contours are normal. The lungs are clear. Pulmonary vasculature is normal. No consolidation, pleural effusion, or pneumothorax. No acute osseous abnormalities are seen. IMPRESSION: Negative radiographs of the chest. Electronically Signed   By: Narda Rutherford M.D.   On: 07/06/2021 19:07   ? ?Procedures ?Procedures  ? ? ?Medications Ordered in ED ?Medications - No data to display ? ?ED Course/ Medical Decision Making/ A&P ?  ?                        ?Medical Decision Making ?Amount and/or Complexity of Data Reviewed ?Labs: ordered. ?Radiology: ordered. ? ? ?This patient presents to the ED for concern of presyncope, this involves an extensive number of treatment options, and is a complaint that carries with it a high risk of complications and morbidity.  The differential  diagnosis includes symptomatic aortic stenosis, PE, ACS, cardiomyopathy, pericarditis, hypoglycemia, electrolyte derangement, dehydration, AKI, orthostatics ? ?Patient?s presentation is complicated by their history of AS ? ? ?Additional history obtained:  ? ?Independent historian: wife ? ?I reviewed external records, patient is followed by Dr. Diona Browner with cardiology.  He has an appointment for follow-up from his echo scheduled in 5 days next week on Tuesday. ? ?Echo 05/25/21 - Aortic stenosis remains moderate range, aortic regurgitation is moderate to severe but very eccentric so somewhat more difficult to quantify.  LV chamber has not dilated and LVEF remains normal at 55 to 60%.  Should have follow-up pending. ? ?  ?Lab Tests: ? ?I ordered, viewed, and personally interpreted labs.  The pertinent results include: No leukocytosis or anemia.  No gross electrolyte derangement or AKI.  Dimer is negative making PE unlikely.  Troponin low at 2.  Patient is not hypoglycemic. ? ?  ?Imaging Studies ordered: ? ?I directly visualized the CXR, which showed no acute process ? ?I agree with the radiologist interpretation ?  ? ?ECG/Cardiac monitoring:  ? ?Per my interpretation, EKG shows sinus arrhythmia.  ? ?The patient was maintained on a cardiac monitor.  Visualized monitor strip which showed sinus arrhythmia per my interpretation.  ? ? ?Medicines ordered and prescription drug management: ? ?I have reviewed the patients home medicines and have made adjustments as needed ? ? ?Test Considered: ? ?Considered admission for further cardiac evaluation but given reassuring work-up and is close outpatient follow-up do not think indicated at this time.  I did discuss this with my attending Dr. Estell Harpin who is in agreement with this plan.   ? ? Reevaluation: ? ?After the interventions noted above, I reevaluated the patient and found resting comfortably, no recurrent symptoms. ? ? ?Problems addressed / ED Course: ?Patient presents with  presyncope.  No actual syncope, intermittent chest pain which does not seem particularly cardiac in nature.  Symptomatic otic stenosis was a consideration but his presentation seems more in line with possible dehydration or anxiety.  He also may have vasovagal given it was very intermittent and not associated with actual syncopal event.  Vertigo also considered.  Work-up is overall reassuring, plan is to have him follow-up with cardiology on Tuesday this week as scheduled.  Return precautions were discussed in detail with patient and his wife.  They verbalized understanding and agreement with this plan. ?  ?Social Determinants of Health: ? ?  ?Disposition: ? ? ?After consideration of the diagnostic results and the patients response to treatment, I feel that the patent  would benefit from cardiology follow up outpatient. ? ?  ? ? ? ? ? ? ? ? ?Final Clinical Impression(s) / ED Diagnoses ?Final diagnoses:  ?None  ? ? ?Rx / DC Orders ?ED Discharge Orders   ? ? None  ? ?  ? ? ?  ?Theron AristaSage, Lindora Alviar, PA-C ?07/06/21 2025 ? ?  ?Bethann BerkshireZammit, Joseph, MD ?07/07/21 1127 ? ?

## 2021-07-06 NOTE — ED Notes (Signed)
2nd trop ?  ?Theron Arista, PA-C ?07/06/21 2012 ? ?

## 2021-07-06 NOTE — ED Triage Notes (Signed)
Pt was at work when he experienced some chest pain, blurred vision and feeling of passing out; pt states the chest pain has since resolved; pt c/o headache ?

## 2021-07-06 NOTE — ED Notes (Signed)
Dr. 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 He had a recent echo which showed he had significant regurgitation. Today while at work he had a sudden onset of palpitations, felt dizzy and lightheaded. Medical personnel evaluated pt at work and reported he might have been in atrial fibrillation. Pt has no hx of this. In the office, he showed NSR on EKG with left ventricular hypertrophy and inverted P waves. BP 134/64, HR 104, O2 sat 99% RA at doctor's office prior to coming to ED. Theron Arista, [place] notified.  ?

## 2021-07-10 ENCOUNTER — Encounter: Payer: Self-pay | Admitting: Physician Assistant

## 2021-07-10 NOTE — Progress Notes (Signed)
? ? ?Office Visit  ?  ?Patient Name: Larry Campos. ?Date of Encounter: 07/11/2021 ? ?Primary Care Provider:  [facility] ?Primary Cardiologist:  Nona Dell, MD ?Primary Electrophysiologist: None ? ?Chief Complaint  ?  ?Larry Brash. is a 26 y.o. male with PMH of bicuspid aortic valve who presents for follow-up of near syncopal event. ? ?Past Medical History  ?  ?Past Medical History:  ?Diagnosis Date  ? Seasonal allergies   ? Unicuspid aortic valve   ? ?Past Surgical History:  ?Procedure Laterality Date  ? 2D echo    ? Echocardiogram done 08/20/19 showed an EF of > 55% withmoderate AI and a AV Mean Grad of 38 mm Hg. b.3/2023aortic stenosis with moderate to severe eccentric aortic regurgitation and EF of 55-60%  ? I & D EXTREMITY Left 12/06/2018  ? Procedure: IRRIGATION AND DEBRIDEMENT EXTREMITY;  Surgeon: Dominica Severin, MD;  Location: [facility] OR;  Service: Orthopedics;  Laterality: Left;  ? TONSILLECTOMY AND ADENOIDECTOMY  12/07/2010  ? Procedure: TONSILLECTOMY AND ADENOIDECTOMY;  Surgeon: Darletta Moll;  Location: [facility] ORS;  Service: ENT;  Laterality: Bilateral;  ? ? ?Allergies ? ?Allergies  ?Allergen Reactions  ? Amoxicillin Hives  ? Penicillins Itching  ?  Did it involve swelling of the face/tongue/throat, SOB, or low BP? No ?Did it involve sudden or severe rash/hives, skin peeling, or any reaction on the inside of your mouth or nose? No ?Did you need to seek medical attention at a hospital or doctor's office? No ?When did it last happen?       ?If all above answers are ?NO?, may proceed with cephalosporin use. ?  ? ? ?History of Present Illness  ?  ?Larry Brash. with PMH of congenital bicuspid aortic valve disease. He has been followed by Dr. Diona Browner since 08/2019 for unicuspid/bicuspid aortic valve disease.  He was diagnosed with congenital abnormal aortic valve that was discovered at [facility] pediatric cardiology. 2D echo  was completed 07/2019 with EF 55%, with severe aortic  regurgitation with moderate aortic stenosis and possible unicuspid aortic valve. Repeat 2D echo was completed 03/2020 with moderate aortic regurgitation and moderate aortic stenosis, mean gradient 23 mmHg.  There was also mild dilatation of the ascending aorta at 38 mm. During his last follow up with Dr. Diona Browner Larry Campos was doing well an denied any exertional CP or syncope with good exercise tolerance. The discussion was also made regarding the need for eventual aortic valve replacement surgery. Most recent 2D echo showed moderate to severe range aortic regurgitation with EF of 55-60%,AV mean gradient of 26.8 mmHg. ? ?He was last seen on 07/06/2021 at [facility] emergency department for near syncopal event.  Patient was at work when he felt a sensation of the room spinning with palpitations.  He also described left-sided chest pain that was sharp but did not radiate.  This episode lasted approximately 5 minutes and passed once Larry Campos sat down and rested.  In the ED his work-up consisted of a D-dimer that was negative, troponins that were flat at 2, and no leukocytosis or anemia.  EKG revealed sinus arrhythmia.  ? ?He presents today for follow-up from recent ED visit.  Larry Campos states that he has not had any additional chest pain or dizziness since his last episode on 5/11.  He has not returned to work since that time and states that he has been taking it easy without performing any manual tasks at home.  His  blood pressure today was 110/60 with heart rate of 82.  Today he denies chest pain, palpitations, dyspnea, PND, orthopnea, nausea, vomiting, dizziness, syncope, edema, weight gain, or early satiety. ? ?Current Outpatient Medications  ?Medication Sig Dispense Refill  ? cetirizine (ZYRTEC) 10 MG tablet Take 10 mg by mouth daily.    ? ?No current facility-administered medications for this visit.  ?  ?Review of Systems  ?Please see the history of present illness.    ? ?All other systems reviewed and are  otherwise negative except as noted above. ? ?Physical Exam  ?  ?Wt Readings from Last 3 Encounters:  ?07/11/21 133 lb 3.2 oz (60.4 kg)  ?07/06/21 132 lb (59.9 kg)  ?05/17/20 133 lb 3.2 oz (60.4 kg)  ? ?VS: ?Vitals:  ? 07/11/21 0845  ?BP: 110/60  ?Pulse: 82  ?SpO2: 99%  ?,Body mass index is 19.67 kg/m?. ? ?Constitutional:   ?   Appearance: Healthy appearance. Not in distress.  ?Neck:  ?   Vascular: JVD normal.  ?Pulmonary:  ?   Effort: Pulmonary effort is normal.  ?   Breath sounds: No wheezing. No rales. Diminished in the bases ?Cardiovascular:  ?   Normal rate. Regular rhythm. Normal S1. Normal S2.   ?   Murmurs: 4/6 systolic murmur at left sternal border.  ?Edema: ?   Peripheral edema absent.  ?Abdominal:  ?   Palpations: Abdomen is soft non tender. There is no hepatomegaly.  ?Skin: ?   General: Skin is warm and dry.  ?Neurological:  ?   General: No focal deficit present.  ?   Mental Status: Alert and oriented to person, place and time.  ?   Cranial Nerves: Cranial nerves are intact.  ?EKG/LABS/Other Studies Reviewed  ?  ?ECG personally reviewed by me today -none completed today ? ?Lab Results  ?Component Value Date  ? WBC 6.7 07/06/2021  ? HGB 14.0 07/06/2021  ? HCT 41.4 07/06/2021  ? MCV 90.8 07/06/2021  ? PLT 207 07/06/2021  ? ?Lab Results  ?Component Value Date  ? CREATININE 0.97 07/06/2021  ? BUN 13 07/06/2021  ? NA 139 07/06/2021  ? K 3.7 07/06/2021  ? CL 107 07/06/2021  ? CO2 26 07/06/2021  ? ?No results found for: ALT, AST, GGT, ALKPHOS, BILITOT ?No results found for: CHOL, HDL, LDLCALC, LDLDIRECT, TRIG, CHOLHDL  ?No results found for: HGBA1C ? ?Assessment & Plan  ?  ?1.  Aortic stenosis /unicuspid aortic valve: ?-[facility] with complaint of presyncope and chest pain that occurred without exertion. ?-Last 2D echo completed on 04/2021 with EF 55-60% and moderate to severe eccentric aortic regurgitation noted with moderate aortic stenosis ?-14-day ZIO monitor  will be worn to  evaluate for possible arrhythmia related to presyncope. ?-Case discussed with DOD Dr. Izora Ribashandrasekhar, who felt best diagnostic test would be a cardiac MRI to evaluate aortic regurgitation in the setting of presyncopal episode and chest pain. ?-Return precautions to the ED discussed regarding recurrent chest pain that is intractable or unchanged with rest ? ?2.  Postural dizziness with pre-syncope: ?-Patient reports no new presyncopal episodes since occurring at work. ?-Patient will wear a 14-day ZIO monitor to evaluate possible arrhythmia related to presyncope ? ?3.  Bicuspid aortic valve: ?-Congenital aortic valve disease that is followed annually with surveillance echo with last complete 04/2021 ? ?Disposition: Follow-up with Nona DellSamuel McDowell, MD in 3 months ? ?Medication Adjustments/Labs and Tests Ordered: ?Current medicines are reviewed at length with the patient today.  Concerns regarding medicines are outlined above.  ? ?Signed, ?Napoleon Form, Leodis Rains, NP ?07/11/2021, 9:46 AM ?[facility] ?

## 2021-07-11 ENCOUNTER — Encounter: Payer: Self-pay | Admitting: Nurse Practitioner

## 2021-07-11 ENCOUNTER — Encounter: Payer: Self-pay | Admitting: *Deleted

## 2021-07-11 ENCOUNTER — Ambulatory Visit: Payer: BC Managed Care – PPO | Admitting: Nurse Practitioner

## 2021-07-11 ENCOUNTER — Ambulatory Visit (INDEPENDENT_AMBULATORY_CARE_PROVIDER_SITE_OTHER): Payer: BC Managed Care – PPO

## 2021-07-11 VITALS — BP 110/60 | HR 82 | Ht 69.0 in | Wt 133.2 lb

## 2021-07-11 DIAGNOSIS — R55 Syncope and collapse: Secondary | ICD-10-CM | POA: Diagnosis not present

## 2021-07-11 DIAGNOSIS — R42 Dizziness and giddiness: Secondary | ICD-10-CM | POA: Diagnosis not present

## 2021-07-11 DIAGNOSIS — Q231 Congenital insufficiency of aortic valve: Secondary | ICD-10-CM | POA: Diagnosis not present

## 2021-07-11 DIAGNOSIS — Q248 Other specified congenital malformations of heart: Secondary | ICD-10-CM

## 2021-07-11 DIAGNOSIS — I350 Nonrheumatic aortic (valve) stenosis: Secondary | ICD-10-CM | POA: Diagnosis not present

## 2021-07-11 NOTE — Telephone Encounter (Signed)
Can you please forward this to patient.  ?

## 2021-07-11 NOTE — Patient Instructions (Signed)
Medication Instructions:  ?Your physician recommends that you continue on your current medications as directed. Please refer to the Current Medication list given to you today. ? ?*If you need a refill on your cardiac medications before your next appointment, please call your pharmacy* ? ? ?Lab Work: ?None orderd ? ?If you have labs (blood work) drawn today and your tests are completely normal, you will receive your results only by: ?MyChart Message (if you have MyChart) OR ?A paper copy in the mail ?If you have any lab test that is abnormal or we need to change your treatment, we will call you to review the results. ? ? ?Testing/Procedures: ?Your physician has requested that you have a cardiac MRI. Cardiac MRI uses a computer to create images of your heart as its beating, producing both still and moving pictures of your heart and major blood vessels. For further information please visit [email or web address]  ? ?ZIO AT Long term monitor-Live Telemetry ? ?Your physician has requested you wear a ZIO patch monitor for 14 days.  ?This is a single patch monitor. [employer] supplies one patch monitor per enrollment. Additional  ?stickers are not available.  ?Please do not apply patch if you will be having a Nuclear Stress Test, Echocardiogram, Cardiac CT, MRI,  ?or Chest Xray during the period you would be wearing the monitor. The patch cannot be worn during  ?these tests. You cannot remove and re-apply the ZIO AT patch monitor.  ?Your ZIO patch monitor will be mailed 3 day USPS to your address on file. It may take 3-5 days to  ?receive your monitor after you have been enrolled.  ?Once you have received your monitor, please review the enclosed instructions. Your monitor has  ?already been registered assigning a specific monitor serial # to you.  ? ?Billing and Patient Assistance Program information ? ?[employer] has been supplied with any insurance information on record for billing. ?[employer] offers a sliding scale Patient  Assistance Program for patients without insurance, or whose  ?insurance does not completely cover the cost of the ZIO patch monitor. You must apply for the  ?Patient Assistance Program to qualify for the discounted rate. To apply, call [employer] at [phone],  ?select option 4, select option 2 , ask to apply for the Patient Assistance Program, (you can request an  ?interpreter if needed). [employer] will ask your household income and how many people are in your  ?household. [employer] will quote your out-of-pocket cost based on this information. They will also be able  ?to set up a 12 month interest free payment plan if needed. ? ?Applying the monitor  ? ?Shave hair from upper left chest.  ?Hold the abrader disc by orange tab. Rub the abrader in 40 strokes over left upper chest as indicated in  ?your monitor instructions.  ?Clean area with 4 enclosed alcohol pads. Use all pads to ensure the area is cleaned thoroughly. Let  ?dry.  ?Apply patch as indicated in monitor instructions. Patch will be placed under collarbone on left side of  ?chest with arrow pointing upward.  ?Rub patch adhesive wings for 2 minutes. Remove the white label marked "1". Remove the white label  ?marked "2". Rub patch adhesive wings for 2 additional minutes.  ?While looking in a mirror, press and release button in center of patch. A small green light will flash 3-4  ?times. This will be your only indicator that the monitor has been turned on.  ?Do not shower for the first 24 hours. You  may shower after the first 24 hours.  ?Press the button if you feel a symptom. You will hear a small click. Record Date, Time and Symptom in  ?the Patient Log.  ? ?Starting the Gateway ? ?In your kit there is a small plastic box the size of a cellphone. This is Airline pilot. It transmits all your  ?recorded data to [employer]. This box must always stay within 10 feet of you. Open the box and push the *  ?button. There will be a light that blinks orange and then green a  few times. When the light stops  ?blinking, the Gateway is connected to the ZIO patch. ?Call [employer] at [phone] to confirm your monitor is transmitting. ? ?Returning your monitor ? ?Remove your patch and place it inside the Gateway. In the lower half of the Gateway there is a white  ?bag with prepaid postage on it. Place Gateway in bag and seal. Mail package back to [place] as soon as  ?possible. Your physician should have your final report approximately 7 days after you have mailed back  ?your monitor. ?Call [facility] at [phone] if you have questions regarding your ZIO AT  ?patch monitor. Call them immediately if you see an orange light blinking on your monitor.  ?If your monitor falls off in less than 4 days, contact our Monitor department at [phone]. If your  ?monitor becomes loose or falls off after 4 days call [employer] at [phone] for suggestions on  ?securing your monitor ? ? ? ? ?Follow-Up: ?At [facility], you and your health needs are our priority.  As part of our continuing mission to provide you with exceptional heart care, we have created designated Provider Care Teams.  These Care Teams include your primary Cardiologist (physician) and Advanced Practice Providers (APPs -  Physician Assistants and Nurse Practitioners) who all work together to provide you with the care you need, when you need it. ? ?We recommend signing up for the patient portal called "MyChart".  Sign up information is provided on this After Visit Summary.  MyChart is used to connect with patients for Virtual Visits (Telemedicine).  Patients are able to view lab/test results, encounter notes, upcoming appointments, etc.  Non-urgent messages can be sent to your provider as well.   ?To learn more about what you can do with MyChart, go to [email or web address].   ? ?Your next appointment:   ?See Dr. Domenic Polite as scheduled ? ?The format for your next appointment:   ?In Person ? ?Provider:    ?Rozann Lesches, MD  ? ? ?Other Instructions ? ? ?Important Information About Sugar ? ? ? ? ? ? ?

## 2021-07-11 NOTE — Progress Notes (Unsigned)
Enrolled for [employer] to mail a ZIO XT long term holter monitor to the patients address on file.  

## 2021-07-11 NOTE — Telephone Encounter (Signed)
Maralyn Sago - I have forwarded this to the ordering provider.  ?

## 2021-07-13 ENCOUNTER — Ambulatory Visit: Payer: BC Managed Care – PPO | Admitting: Cardiology

## 2021-08-01 ENCOUNTER — Telehealth: Payer: Self-pay | Admitting: Cardiology

## 2021-08-01 NOTE — Telephone Encounter (Signed)
Received & patient just came into office to drop off another copy.

## 2021-08-01 NOTE — Telephone Encounter (Signed)
Pt called stating he is faxing FMLA forms tomorrow to the [place] office.  He is requesting a callback when the docs have been received.

## 2021-08-02 ENCOUNTER — Telehealth: Payer: Self-pay | Admitting: Nurse Practitioner

## 2021-08-02 NOTE — Telephone Encounter (Signed)
Patient dropped off FMLA forms to the [place] office to be sent interoffice to the [place] Office to be filled out by Robin Searing, NP.

## 2021-08-03 DIAGNOSIS — Z0279 Encounter for issue of other medical certificate: Secondary | ICD-10-CM

## 2021-08-03 NOTE — Telephone Encounter (Signed)
Pt came to [facility] office to sign the other form to complete FMLA paper work and to pay the $29.00 fee.  Paid with cash- payment is at the [place] office- please let me know when charge has been posted.   Will send paper work STAT via interoffice - forms need to be sent in by 08/11/2021 per pt's message from [facility]

## 2021-08-28 ENCOUNTER — Other Ambulatory Visit (HOSPITAL_COMMUNITY): Payer: BC Managed Care – PPO

## 2021-08-30 NOTE — Telephone Encounter (Signed)
Patient called stating his HR Dept told him Page 3, question 9 needs to be completed and re-faxed.  He stated his status should be listed as part time to cover his doctor's appointments.

## 2021-08-30 NOTE — Telephone Encounter (Signed)
Forms were received back from [place] on 08-30-21, and forms were faxed and called patient to let them know that they were ready. Forms scanned into documents and placed in folder for patient to pick up on Friday 09-01-21.

## 2021-08-31 ENCOUNTER — Telehealth (HOSPITAL_COMMUNITY): Payer: Self-pay | Admitting: *Deleted

## 2021-08-31 NOTE — Telephone Encounter (Signed)
Reaching out to patient to offer assistance regarding upcoming cardiac imaging study; pt verbalizes understanding of appt date/time, parking situation and where to check in, verified current allergies; name and call back number provided for further questions should they arise  Larey Brick RN Navigator Cardiac Imaging [facility] [phone] office [phone] cell  Denies claustrophobia and metal.

## 2021-08-31 NOTE — Telephone Encounter (Signed)
His forms were completed in [place], not the [place] office.

## 2021-09-01 ENCOUNTER — Ambulatory Visit (HOSPITAL_COMMUNITY)
Admission: RE | Admit: 2021-09-01 | Discharge: 2021-09-01 | Disposition: A | Discharge status: Home / Self Care | Payer: BC Managed Care – PPO | Source: Ambulatory Visit | Attending: Nurse Practitioner | Admitting: Nurse Practitioner

## 2021-09-01 ENCOUNTER — Other Ambulatory Visit: Payer: Self-pay | Admitting: Nurse Practitioner

## 2021-09-01 DIAGNOSIS — R42 Dizziness and giddiness: Secondary | ICD-10-CM

## 2021-09-01 DIAGNOSIS — I350 Nonrheumatic aortic (valve) stenosis: Secondary | ICD-10-CM | POA: Diagnosis not present

## 2021-09-01 DIAGNOSIS — R55 Syncope and collapse: Secondary | ICD-10-CM | POA: Diagnosis not present

## 2021-09-01 MED ORDER — GADOBUTROL 1 MMOL/ML IV SOLN
7.0000 mL | Freq: Once | INTRAVENOUS | Status: AC | PRN
  Administered 2021-09-01: 7 mL via INTRAVENOUS

## 2021-09-07 NOTE — Telephone Encounter (Addendum)
I spoke with the patient regarding his forms and he states that his HR team told him that the forms were completed for him to be out until 01/26/22. He states that he returned to work on 07/17/21. He states that he needs question 9 on page 3 of the documents completed for intermittent leave for appts, episodes, etc. Made him aware that [employer] is out of the office until 7/18. He states that it can wait until then. Will have [employer] review/revise forms on 7/18 and re-fax the same day. Patient appreciated the call.

## 2021-09-12 NOTE — Telephone Encounter (Signed)
Completed documents faxed to [phone]. AttnSaddie Benders.

## 2021-11-16 ENCOUNTER — Encounter: Payer: Self-pay | Admitting: Cardiology

## 2021-11-16 ENCOUNTER — Ambulatory Visit: Payer: BC Managed Care – PPO | Attending: Cardiology | Admitting: Cardiology

## 2021-11-16 VITALS — BP 118/76 | HR 76 | Ht 68.0 in | Wt 133.4 lb

## 2021-11-16 DIAGNOSIS — Q231 Congenital insufficiency of aortic valve: Secondary | ICD-10-CM | POA: Diagnosis not present

## 2021-11-16 DIAGNOSIS — I350 Nonrheumatic aortic (valve) stenosis: Secondary | ICD-10-CM

## 2021-11-16 DIAGNOSIS — I351 Nonrheumatic aortic (valve) insufficiency: Secondary | ICD-10-CM

## 2021-11-16 NOTE — Patient Instructions (Addendum)
Medication Instructions:  Your physician recommends that you continue on your current medications as directed. Please refer to the Current Medication list given to you today.  Labwork: none  Testing/Procedures: none  Follow-Up: Your physician recommends that you schedule a follow-up appointment in: 6 months  Any Other Special Instructions Will Be Listed Below (If Applicable). You have been referred to [facility]  If you need a refill on your cardiac medications before your next appointment, please call your pharmacy.

## 2021-11-16 NOTE — Progress Notes (Signed)
Cardiology Office Note  Date: 11/16/2021   ID: Larry Campos., DOB 1996/01/14, MRN [id number]  PCP:  [facility]  Cardiologist:  Rozann Lesches, MD Electrophysiologist:  None   Chief Complaint  Patient presents with   Cardiac follow-up    History of Present Illness: Larry Campos. is a 26 y.o. male last seen in May by Mr. Larry Ehlers NP, I reviewed the note.  He is here today with his wife for a follow-up visit.  States overall he has been doing well, largely NYHA class I-II dyspnea.  At times he feels a sharp discomfort in his chest with exertion but this is not always predictable, gets better when he rests.  No sense of palpitations or syncope.  No orthopnea or PND.  I did review his echocardiogram from March and also more recent cardiac MRI done in July.  Although reported to have had a unicuspid aortic valve by previous work-up, his cardiac MRI is more consistent with a bicuspid aortic valve.  He also has severe aortic regurgitation by that study with mild to moderate dilatation of the LV, LVEF 55%.  He had moderate aortic stenosis by prior echocardiogram with mean gradient 27 mmHg and dimensionless index 0.42.  Aorta borderline dilated at 39 mm.  We went over the results of his studies over the last 6 months.  Also discussed referral to the structural heart team to get him established for follow-up and further discussion ultimately about valve replacement.  Still working full-time at [employer].  Past Medical History:  Diagnosis Date   Seasonal allergies    Unicuspid aortic valve     Past Surgical History:  Procedure Laterality Date   2D echo     Echocardiogram done 08/20/19 showed an EF of > 55% withmoderate AI and a AV Mean Grad of 38 mm Hg. b.3/2023aortic stenosis with moderate to severe eccentric aortic regurgitation and EF of 55-60%   I & D EXTREMITY Left 12/06/2018   Procedure: IRRIGATION AND DEBRIDEMENT EXTREMITY;  Surgeon: Roseanne Kaufman, MD;   Location: [facility];  Service: Orthopedics;  Laterality: Left;   TONSILLECTOMY AND ADENOIDECTOMY  12/07/2010   Procedure: TONSILLECTOMY AND ADENOIDECTOMY;  Surgeon: Ascencion Dike;  Location: [facility];  Service: ENT;  Laterality: Bilateral;    Current Outpatient Medications  Medication Sig Dispense Refill   cetirizine (ZYRTEC) 10 MG tablet Take 10 mg by mouth daily.     No current facility-administered medications for this visit.   Allergies:  Amoxicillin and Penicillins   ROS: No palpitations or sudden syncope.  Physical Exam: VS:  BP 118/76 (BP Location: Left Arm, Patient Position: Sitting, Cuff Size: Normal)   Pulse 76   Ht 5\' 8"  (1.727 m)   Wt 133 lb 6.4 oz (60.5 kg)   SpO2 98%   BMI 20.28 kg/m , BMI Body mass index is 20.28 kg/m.  Wt Readings from Last 3 Encounters:  11/16/21 133 lb 6.4 oz (60.5 kg)  07/11/21 133 lb 3.2 oz (60.4 kg)  07/06/21 132 lb (59.9 kg)    General: Patient appears comfortable at rest. HEENT: Conjunctiva and lids normal, oropharynx clear with moist mucosa. Neck: Supple, no elevated JVP or carotid bruits, no thyromegaly. Lungs: Clear to auscultation, nonlabored breathing at rest. Cardiac: Regular rate and rhythm, no S3, 4/6 harsh systolic murmur with 3/6 diastolic murmur mainly right upper sternal border, no pericardial rub. Extremities: No pitting edema, distal pulses 2+.  ECG:  An ECG dated 07/06/2021 was personally  reviewed today and demonstrated:  Sinus arrhythmia.  Recent Labwork: 07/06/2021: BUN 13; Creatinine, Ser 0.97; Hemoglobin 14.0; Platelets 207; Potassium 3.7; Sodium 139   Other Studies Reviewed Today:  Echocardiogram 05/25/2021:  1. Left ventricular ejection fraction, by estimation, is 55 to 60%. The  left ventricle has normal function. The left ventricle has no regional  wall motion abnormalities. Left ventricular diastolic parameters were  normal. The average left ventricular  global longitudinal strain is -19.3 %. The global longitudinal  strain is  normal.   2. Right ventricular systolic function is normal. The right ventricular  size is normal. There is normal pulmonary artery systolic pressure. The  estimated right ventricular systolic pressure is 27.8 mmHg.   3. The mitral valve is grossly normal. Trivial mitral valve  regurgitation.   4. The aortic valve is abnormal. Described as unicuspid previously and  does appear that way in some views, in others more bicuspid. Aortic valve  regurgitation is moderate to severe, eccentric (no clear flow reversal in  aortic arch). Moderate aortic  valve stenosis. Aortic regurgitation PHT measures 380 msec. Aortic valve  area, by VTI measures 1.32 cm. Aortic valve mean gradient measures 26.8  mmHg. Dimentiionless index 0.42.   5. Aortic dilatation noted. There is borderline dilatation of the aortic  root, measuring 39 mm.   6. The inferior vena cava is normal in size with greater than 50%  respiratory variability, suggesting right atrial pressure of 3 mmHg.   Cardiac monitor June 2023: ZIO XT reviewed.  13 days, 21 hours analyzed.   1.  Predominant rhythm is sinus with heart rate ranging from 45 bpm up to 149 bpm and average heart rate 80 bpm.   2.  There were rare PACs including atrial couplets representing less than 1% total beats.  There were also rare PVCs including ventricular couplets and triplets representing less than 1% total beats. 3.  No significant arrhythmias or pauses. 4.  Patient triggered events and diary entries corresponded with sinus rhythm and sinus tachycardia.  Cardiac MRI July 2023: IMPRESSION: 1.  Mild to moderate LV dilation with EF 55%, normal wall motion.   2.  Normal RV size and systolic function, EF 52%.   3.  Mildly dilated aortic root and ascending aorta.   4. Bicuspid aortic valve with severe aortic regurgitation (regurgitant fraction 55%). There was a degree of aortic stenosis also present better assessed by echo.   5. Visually there did  not appear to be significant mitral regurgitation though calculated regurgitant fraction was 20%. Would confirm presence or absence by echo.   6.  No delayed enhancement.  Assessment and Plan:  Congenital bicuspid aortic valve with moderate aortic stenosis, mean gradient 27 mmHg and dimensionless index 0.42 by echocardiogram in March, also severe associated aortic regurgitation by more recent cardiac MRI.  He had mild to moderate LV dilatation with that study and LVEF calculated at 55%.  Although symptomatically relatively stable, with LV dilatation and severe aortic regurgitation he is nearing consideration for valve replacement and I would like to refer him to the structural heart team for further management and coordination of follow-up as well as additional testing.  We discussed these issues today and he is in agreement for further consultation.  Medication Adjustments/Labs and Tests Ordered: Current medicines are reviewed at length with the patient today.  Concerns regarding medicines are outlined above.   Tests Ordered: Orders Placed This Encounter  Procedures   Ambulatory referral to Structural Heart/Valve Clinic (only at  [facility])    Medication Changes: No orders of the defined types were placed in this encounter.   Disposition:  Follow up  6 months.  Signed, Jonelle Sidle, MD, [facility] 11/16/2021 4:44 PM    [facility] at [facility] [address] [place], [place], [place] [address] Phone: [phone]; Fax: [phone]

## 2021-11-29 NOTE — H&P (View-Only) (Signed)
Patient ID: Larry Campos. MRN: [id number] DOB/AGE: 1995-12-05 26 y.o.  Primary Care Physician:[facility] Primary Cardiologist: Rozann Lesches, MD  FOCUSED CARDIOVASCULAR PROBLEM LIST:   1.  Bicuspid aortic valve with severe aortic insufficiency and mild to moderate LV dilatation 2.  Ascending aortic aneurysm 4.2 cm  HISTORY OF PRESENT ILLNESS: The patient is a 26 y.o. male with the indicated medical history here for recommendations regarding his aortic valvular disease.  He was seen by his primary cardiologist noted NYHA class II symptoms of dyspnea.  He also had some occasional fleeting chest pain.  He is referred for further recommendations.  The patient tells me that he has definitely been more short of breath versus last year.  He is to be able to play a full game of basketball without any limitations in the hemoptysis stop and take breaks.  He occasionally develops fleeting atypical chest pain at times.  He has had some occasional lightheadedness but no frank syncope.  He denies any palpitations, paroxysmal nocturnal dyspnea, orthopnea, or signs or symptoms of stroke.  He does not smoke.  He last saw dentist in January and he says he has had no problems with his teeth.  He is otherwise well without complaints.  Past Medical History:  Diagnosis Date   Seasonal allergies    Unicuspid aortic valve     Past Surgical History:  Procedure Laterality Date   2D echo     Echocardiogram done 08/20/19 showed an EF of > 55% withmoderate AI and a AV Mean Grad of 38 mm Hg. b.3/2023aortic stenosis with moderate to severe eccentric aortic regurgitation and EF of 55-60%   I & D EXTREMITY Left 12/06/2018   Procedure: IRRIGATION AND DEBRIDEMENT EXTREMITY;  Surgeon: Roseanne Kaufman, MD;  Location: [facility];  Service: Orthopedics;  Laterality: Left;   TONSILLECTOMY AND ADENOIDECTOMY  12/07/2010   Procedure: TONSILLECTOMY AND ADENOIDECTOMY;  Surgeon: Ascencion Dike;  Location: [facility];   Service: ENT;  Laterality: Bilateral;    Family History  Problem Relation Age of Onset   Healthy Mother    Healthy Father     Social History   Socioeconomic History   Marital status: Married    Spouse name: Not on file   Number of children: Not on file   Years of education: Not on file   Highest education level: Not on file  Occupational History   Not on file  Tobacco Use   Smoking status: Never    Passive exposure: Never   Smokeless tobacco: Never  Vaping Use   Vaping Use: Every day   Substances: Nicotine, Flavoring  Substance and Sexual Activity   Alcohol use: Yes    Comment: occ   Drug use: No   Sexual activity: Not on file  Other Topics Concern   Not on file  Social History Narrative   Not on file   Social Determinants of Health   Financial Resource Strain: Not on file  Food Insecurity: Not on file  Transportation Needs: Not on file  Physical Activity: Not on file  Stress: Not on file  Social Connections: Not on file  Intimate Partner Violence: Not on file     Prior to Admission medications   Medication Sig Start Date End Date Taking? Authorizing Provider  cetirizine (ZYRTEC) 10 MG tablet Take 10 mg by mouth daily.    [provider]    Allergies  Allergen Reactions   Amoxicillin Hives   Penicillins Itching  Did it involve swelling of the face/tongue/throat, SOB, or low BP? No Did it involve sudden or severe rash/hives, skin peeling, or any reaction on the inside of your mouth or nose? No Did you need to seek medical attention at a hospital or doctor's office? No When did it last happen?       If all above answers are "NO", may proceed with cephalosporin use.     REVIEW OF SYSTEMS:  General: no fevers/chills/night sweats Eyes: no blurry vision, diplopia, or amaurosis ENT: no sore throat or hearing loss Resp: no cough, wheezing, or hemoptysis CV: no edema or palpitations GI: no abdominal pain, nausea, vomiting, diarrhea, or  constipation GU: no dysuria, frequency, or hematuria Skin: no rash Neuro: no headache, numbness, tingling, or weakness of extremities Musculoskeletal: no joint pain or swelling Heme: no bleeding, DVT, or easy bruising Endo: no polydipsia or polyuria  Ht 5' 8" (1.727 m)   Wt 137 lb 3.2 oz (62.2 kg)   BMI 20.86 kg/m   PHYSICAL EXAM: GEN:  AO x 3 in no acute distress HEENT: normal Dentition: Normal Neck: JVP normal. +2 carotid upstrokes without bruits. No thyromegaly. Lungs: equal expansion, clear bilaterally CV: 3 out of 6 systolic murmur with diastolic component at the left sternal border Abd: soft, non-tender, non-distended; no bruit; positive bowel sounds Ext: no edema, ecchymoses, or cyanosis Vascular: 2+ femoral pulses, 2+ radial pulses       Skin: warm and dry without rash Neuro: CN II-XII grossly intact; motor and sensory grossly intact    DATA AND STUDIES:  EKG: May 2023 sinus rhythm with incomplete right bundle branch block  CARDIAC MR 2023: 1.  Mild to moderate LV dilation with EF 55%, normal wall motion. 2.  Normal RV size and systolic function, EF 52%. 3.  Mildly dilated aortic root and ascending aorta. 4. Bicuspid aortic valve with severe aortic regurgitation (regurgitant fraction 55%). There was a degree of aortic stenosis also present better assessed by echo. 5. Visually there did not appear to be significant mitral regurgitation though calculated regurgitant fraction was 20%. Would confirm presence or absence by echo. 6.  No delayed enhancement.  TTE 2023:  1. Left ventricular ejection fraction, by estimation, is 55 to 60%. The  left ventricle has normal function. The left ventricle has no regional  wall motion abnormalities. Left ventricular diastolic parameters were  normal. The average left ventricular  global longitudinal strain is -19.3 %. The global longitudinal strain is  normal.   2. Right ventricular systolic function is normal. The right  ventricular  size is normal. There is normal pulmonary artery systolic pressure. The  estimated right ventricular systolic pressure is 27.8 mmHg.   3. The mitral valve is grossly normal. Trivial mitral valve  regurgitation.   4. The aortic valve is abnormal. Described as unicuspid previously and  does appear that way in some views, in others more bicuspid. Aortic valve  regurgitation is moderate to severe, eccentric (no clear flow reversal in  aortic arch). Moderate aortic  valve stenosis. Aortic regurgitation PHT measures 380 msec. Aortic valve  area, by VTI measures 1.32 cm. Aortic valve mean gradient measures 26.8  mmHg. Dimentiionless index 0.42.   5. Aortic dilatation noted. There is borderline dilatation of the aortic  root, measuring 39 mm.   6. The inferior vena cava is normal in size with greater than 50%  respiratory variability, suggesting right atrial pressure of 3 mmHg.   CARDIAC CATH: n/a  STS RISK CALCULATOR:   n/a  NHYA CLASS: 2    ASSESSMENT AND PLAN:   Aortic insufficiency due to bicuspid aortic valve - Plan: EKG 12-Lead, Basic metabolic panel, CBC, CT ANGIO CHEST AORTA W/CM & OR WO/CM, Ambulatory referral to Cardiothoracic Surgery, CANCELED: Ambulatory referral to Cardiothoracic Surgery  Aneurysm of ascending aorta without rupture (HCC) - Plan: EKG 12-Lead, Basic metabolic panel, CBC, CT ANGIO CHEST AORTA W/CM & OR WO/CM, Ambulatory referral to Cardiothoracic Surgery, CANCELED: Ambulatory referral to Cardiothoracic Surgery  Pre-procedure lab exam - Plan: Basic metabolic panel, CBC  I reviewed all of the patient's imaging data.  He has developed New York Heart Association class II symptoms of shortness of breath.  We will obtain a CTA to evaluate his ascending aorta completely.  I will refer him for coronary angiography and right heart catheterization study for preoperative assessment.  I will refer him to cardiothoracic surgery for further evaluation.  I did inform  the patient that his first-degree relatives need to be screened as well.  I also did inform him that he will likely receive a mechanical prosthesis as well.  All questions were answered.  I have personally reviewed the patients imaging data as summarized above.  I have reviewed the natural history of aortic stenosis with the patient and family members who are present today. We have discussed the limitations of medical therapy and the poor prognosis associated with symptomatic aortic stenosis. We have also reviewed potential treatment options, including palliative medical therapy, conventional surgical aortic valve replacement, and transcatheter aortic valve replacement. We discussed treatment options in the context of this patient's specific comorbid medical conditions.   All of the patient's questions were answered today. Will make further recommendations based on the results of studies outlined above.   Total time spent with patient today 60 minutes. This includes reviewing records, evaluating the patient and coordinating care.   Dunya Meiners K Echo [place], MD  12/04/2021 10:26 AM    [facility] [address], [place],   [address] Phone: [phone]; Fax: [phone]     

## 2021-11-29 NOTE — Progress Notes (Signed)
Patient ID: Larry Campos. MRN: [id number] DOB/AGE: 1995-12-05 26 y.o.  Primary Care Physician:[facility] Primary Cardiologist: Rozann Lesches, MD  FOCUSED CARDIOVASCULAR PROBLEM LIST:   1.  Bicuspid aortic valve with severe aortic insufficiency and mild to moderate LV dilatation 2.  Ascending aortic aneurysm 4.2 cm  HISTORY OF PRESENT ILLNESS: The patient is a 26 y.o. male with the indicated medical history here for recommendations regarding his aortic valvular disease.  He was seen by his primary cardiologist noted NYHA class II symptoms of dyspnea.  He also had some occasional fleeting chest pain.  He is referred for further recommendations.  The patient tells me that he has definitely been more short of breath versus last year.  He is to be able to play a full game of basketball without any limitations in the hemoptysis stop and take breaks.  He occasionally develops fleeting atypical chest pain at times.  He has had some occasional lightheadedness but no frank syncope.  He denies any palpitations, paroxysmal nocturnal dyspnea, orthopnea, or signs or symptoms of stroke.  He does not smoke.  He last saw dentist in January and he says he has had no problems with his teeth.  He is otherwise well without complaints.  Past Medical History:  Diagnosis Date   Seasonal allergies    Unicuspid aortic valve     Past Surgical History:  Procedure Laterality Date   2D echo     Echocardiogram done 08/20/19 showed an EF of > 55% withmoderate AI and a AV Mean Grad of 38 mm Hg. b.3/2023aortic stenosis with moderate to severe eccentric aortic regurgitation and EF of 55-60%   I & D EXTREMITY Left 12/06/2018   Procedure: IRRIGATION AND DEBRIDEMENT EXTREMITY;  Surgeon: Roseanne Kaufman, MD;  Location: [facility];  Service: Orthopedics;  Laterality: Left;   TONSILLECTOMY AND ADENOIDECTOMY  12/07/2010   Procedure: TONSILLECTOMY AND ADENOIDECTOMY;  Surgeon: Ascencion Dike;  Location: [facility];   Service: ENT;  Laterality: Bilateral;    Family History  Problem Relation Age of Onset   Healthy Mother    Healthy Father     Social History   Socioeconomic History   Marital status: Married    Spouse name: Not on file   Number of children: Not on file   Years of education: Not on file   Highest education level: Not on file  Occupational History   Not on file  Tobacco Use   Smoking status: Never    Passive exposure: Never   Smokeless tobacco: Never  Vaping Use   Vaping Use: Every day   Substances: Nicotine, Flavoring  Substance and Sexual Activity   Alcohol use: Yes    Comment: occ   Drug use: No   Sexual activity: Not on file  Other Topics Concern   Not on file  Social History Narrative   Not on file   Social Determinants of Health   Financial Resource Strain: Not on file  Food Insecurity: Not on file  Transportation Needs: Not on file  Physical Activity: Not on file  Stress: Not on file  Social Connections: Not on file  Intimate Partner Violence: Not on file     Prior to Admission medications   Medication Sig Start Date End Date Taking? Authorizing Provider  cetirizine (ZYRTEC) 10 MG tablet Take 10 mg by mouth daily.    [provider]    Allergies  Allergen Reactions   Amoxicillin Hives   Penicillins Itching  Did it involve swelling of the face/tongue/throat, SOB, or low BP? No Did it involve sudden or severe rash/hives, skin peeling, or any reaction on the inside of your mouth or nose? No Did you need to seek medical attention at a hospital or doctor's office? No When did it last happen?       If all above answers are "NO", may proceed with cephalosporin use.     REVIEW OF SYSTEMS:  General: no fevers/chills/night sweats Eyes: no blurry vision, diplopia, or amaurosis ENT: no sore throat or hearing loss Resp: no cough, wheezing, or hemoptysis CV: no edema or palpitations GI: no abdominal pain, nausea, vomiting, diarrhea, or  constipation GU: no dysuria, frequency, or hematuria Skin: no rash Neuro: no headache, numbness, tingling, or weakness of extremities Musculoskeletal: no joint pain or swelling Heme: no bleeding, DVT, or easy bruising Endo: no polydipsia or polyuria  Ht 5\' 8"  (1.727 m)   Wt 137 lb 3.2 oz (62.2 kg)   BMI 20.86 kg/m   PHYSICAL EXAM: GEN:  AO x 3 in no acute distress HEENT: normal Dentition: Normal Neck: JVP normal. +2 carotid upstrokes without bruits. No thyromegaly. Lungs: equal expansion, clear bilaterally CV: 3 out of 6 systolic murmur with diastolic component at the left sternal border Abd: soft, non-tender, non-distended; no bruit; positive bowel sounds Ext: no edema, ecchymoses, or cyanosis Vascular: 2+ femoral pulses, 2+ radial pulses       Skin: warm and dry without rash Neuro: CN II-XII grossly intact; motor and sensory grossly intact    DATA AND STUDIES:  EKG: May 2023 sinus rhythm with incomplete right bundle branch block  CARDIAC MR 2023: 1.  Mild to moderate LV dilation with EF 55%, normal wall motion. 2.  Normal RV size and systolic function, EF 52%. 3.  Mildly dilated aortic root and ascending aorta. 4. Bicuspid aortic valve with severe aortic regurgitation (regurgitant fraction 55%). There was a degree of aortic stenosis also present better assessed by echo. 5. Visually there did not appear to be significant mitral regurgitation though calculated regurgitant fraction was 20%. Would confirm presence or absence by echo. 6.  No delayed enhancement.  TTE 2023:  1. Left ventricular ejection fraction, by estimation, is 55 to 60%. The  left ventricle has normal function. The left ventricle has no regional  wall motion abnormalities. Left ventricular diastolic parameters were  normal. The average left ventricular  global longitudinal strain is -19.3 %. The global longitudinal strain is  normal.   2. Right ventricular systolic function is normal. The right  ventricular  size is normal. There is normal pulmonary artery systolic pressure. The  estimated right ventricular systolic pressure is 27.8 mmHg.   3. The mitral valve is grossly normal. Trivial mitral valve  regurgitation.   4. The aortic valve is abnormal. Described as unicuspid previously and  does appear that way in some views, in others more bicuspid. Aortic valve  regurgitation is moderate to severe, eccentric (no clear flow reversal in  aortic arch). Moderate aortic  valve stenosis. Aortic regurgitation PHT measures 380 msec. Aortic valve  area, by VTI measures 1.32 cm. Aortic valve mean gradient measures 26.8  mmHg. Dimentiionless index 0.42.   5. Aortic dilatation noted. There is borderline dilatation of the aortic  root, measuring 39 mm.   6. The inferior vena cava is normal in size with greater than 50%  respiratory variability, suggesting right atrial pressure of 3 mmHg.   CARDIAC CATH: n/a  STS RISK CALCULATOR:  n/a  NHYA CLASS: 2    ASSESSMENT AND PLAN:   Aortic insufficiency due to bicuspid aortic valve - Plan: EKG 12-Lead, Basic metabolic panel, CBC, CT ANGIO CHEST AORTA W/CM & OR WO/CM, Ambulatory referral to Cardiothoracic Surgery, CANCELED: Ambulatory referral to Cardiothoracic Surgery  Aneurysm of ascending aorta without rupture (HCC) - Plan: EKG 12-Lead, Basic metabolic panel, CBC, CT ANGIO CHEST AORTA W/CM & OR WO/CM, Ambulatory referral to Cardiothoracic Surgery, CANCELED: Ambulatory referral to Cardiothoracic Surgery  Pre-procedure lab exam - Plan: Basic metabolic panel, CBC  I reviewed all of the patient's imaging data.  He has developed New York Heart Association class II symptoms of shortness of breath.  We will obtain a CTA to evaluate his ascending aorta completely.  I will refer him for coronary angiography and right heart catheterization study for preoperative assessment.  I will refer him to cardiothoracic surgery for further evaluation.  I did inform  the patient that his first-degree relatives need to be screened as well.  I also did inform him that he will likely receive a mechanical prosthesis as well.  All questions were answered.  I have personally reviewed the patients imaging data as summarized above.  I have reviewed the natural history of aortic stenosis with the patient and family members who are present today. We have discussed the limitations of medical therapy and the poor prognosis associated with symptomatic aortic stenosis. We have also reviewed potential treatment options, including palliative medical therapy, conventional surgical aortic valve replacement, and transcatheter aortic valve replacement. We discussed treatment options in the context of this patient's specific comorbid medical conditions.   All of the patient's questions were answered today. Will make further recommendations based on the results of studies outlined above.   Total time spent with patient today 60 minutes. This includes reviewing records, evaluating the patient and coordinating care.   Orbie Pyo, MD  12/04/2021 10:26 AM    [facility] [address] [place], [place], [place]  [address] Phone: [phone]; Fax: [phone]

## 2021-12-04 ENCOUNTER — Encounter: Payer: Self-pay | Admitting: Internal Medicine

## 2021-12-04 ENCOUNTER — Ambulatory Visit: Payer: BC Managed Care – PPO | Attending: Internal Medicine | Admitting: Internal Medicine

## 2021-12-04 VITALS — BP 116/70 | Ht 68.0 in | Wt 137.2 lb

## 2021-12-04 DIAGNOSIS — Z01812 Encounter for preprocedural laboratory examination: Secondary | ICD-10-CM | POA: Diagnosis not present

## 2021-12-04 DIAGNOSIS — Q231 Congenital insufficiency of aortic valve: Secondary | ICD-10-CM

## 2021-12-04 DIAGNOSIS — I7121 Aneurysm of the ascending aorta, without rupture: Secondary | ICD-10-CM | POA: Diagnosis not present

## 2021-12-04 LAB — BASIC METABOLIC PANEL
BUN/Creatinine Ratio: 17 (ref 9–20)
BUN: 17 mg/dL (ref 6–20)
CO2: 30 mmol/L — ABNORMAL HIGH (ref 20–29)
Calcium: 10.1 mg/dL (ref 8.7–10.2)
Chloride: 103 mmol/L (ref 96–106)
Creatinine, Ser: 1.01 mg/dL (ref 0.76–1.27)
Glucose: 92 mg/dL (ref 70–99)
Potassium: 4.6 mmol/L (ref 3.5–5.2)
Sodium: 140 mmol/L (ref 134–144)
eGFR: 107 mL/min/{1.73_m2} (ref >59)

## 2021-12-04 LAB — CBC
Hematocrit: 43.7 % (ref 37.5–51.0)
Hemoglobin: 15.1 g/dL (ref 13.0–17.7)
MCH: 30.6 pg (ref 26.6–33.0)
MCHC: 34.6 g/dL (ref 31.5–35.7)
MCV: 89 fL (ref 79–97)
Platelets: 218 10*3/uL (ref 150–450)
RBC: 4.94 x10E6/uL (ref 4.14–5.80)
RDW: 13.1 % (ref 11.6–15.4)
WBC: 7.3 10*3/uL (ref 3.4–10.8)

## 2021-12-04 NOTE — Patient Instructions (Signed)
Medication Instructions:  No changes *If you need a refill on your cardiac medications before your next appointment, please call your pharmacy*   Lab Work: Today: cbc, bmet If you have labs (blood work) drawn today and your tests are completely normal, you will receive your results only by: [place] (if you have MyChart) OR A paper copy in the mail If you have any lab test that is abnormal or we need to change your treatment, we will call you to review the results.   Testing/Procedures: 1.) CT Chest Aorta   2.)Your physician has requested that you have a cardiac catheterization. Cardiac catheterization is used to diagnose and/or treat various heart conditions. Doctors may recommend this procedure for a number of different reasons. The most common reason is to evaluate chest pain. Chest pain can be a symptom of coronary artery disease (CAD), and cardiac catheterization can show whether plaque is narrowing or blocking your heart's arteries. This procedure is also used to evaluate the valves, as well as measure the blood flow and oxygen levels in different parts of your heart. For further information please visit [email or web address]. Please follow instruction sheet, as given.    Follow-Up: With Dr. Domenic Polite as planned  Referral placed to [facility]  you will be contacted by their office with an appointment.  Other Instructions    Cardiac/Peripheral Catheterization   You are scheduled for a Cardiac Catheterization on Monday, October 23 with Dr. Lenna Sciara.  1. Please arrive at the Main Entrance A at [facility] [place], [place] [address] on October 23 at 7:00 AM (This time is two hours before your procedure to ensure your preparation). Free valet parking service is available. You will check in at ADMITTING. The support person will be asked to wait in the waiting room.  It is OK to have someone drop you off and come back when you  are ready to be discharged.        Special note: Every effort is made to have your procedure done on time. Please understand that emergencies sometimes delay scheduled procedures.   . 2. Diet: Do not eat solid foods after midnight.  You may have clear liquids until 5 AM the day of the procedure.  3. Labs: You will need to have blood drawn today.  You do not need to be fasting.  4. Medication instructions in preparation for your procedure:   Contrast Allergy: No  On the morning of your procedure, take Aspirin 81 mg and any morning medicines NOT listed above.  You may use sips of water.  5. Plan to go home the same day, you will only stay overnight if medically necessary. 6. You MUST have a responsible adult to drive you home. 7. An adult MUST be with you the first 24 hours after you arrive home. 8. Bring a current list of your medications, and the last time and date medication taken. 9. Bring ID and current insurance cards. 10.Please wear clothes that are easy to get on and off and wear slip-on shoes.  Thank you for allowing [place] to care for you!   -- [facility]   Important Information About Sugar

## 2021-12-11 ENCOUNTER — Ambulatory Visit (HOSPITAL_COMMUNITY)
Admission: RE | Admit: 2021-12-11 | Discharge: 2021-12-11 | Disposition: A | Discharge status: Home / Self Care | Payer: BC Managed Care – PPO | Source: Ambulatory Visit | Attending: Internal Medicine | Admitting: Internal Medicine

## 2021-12-11 DIAGNOSIS — I7121 Aneurysm of the ascending aorta, without rupture: Secondary | ICD-10-CM | POA: Diagnosis not present

## 2021-12-11 DIAGNOSIS — Q231 Congenital insufficiency of aortic valve: Secondary | ICD-10-CM | POA: Diagnosis not present

## 2021-12-11 MED ORDER — IOHEXOL 350 MG/ML SOLN
80.0000 mL | Freq: Once | INTRAVENOUS | Status: AC | PRN
  Administered 2021-12-11: 80 mL via INTRAVENOUS

## 2021-12-13 ENCOUNTER — Telehealth: Payer: Self-pay | Admitting: *Deleted

## 2021-12-13 NOTE — Telephone Encounter (Signed)
Cardiac Catheterization scheduled at [facility] for: Monday December 18, 2021 9 AM Arrival time and place: [place] Entrance A at: 7 AM  Nothing to eat after midnight prior to procedure, clear liquids until 5 AM day of procedure.  Medication instructions: -Usual morning medications can be taken with sips of water including aspirin 81 mg.  Confirmed patient has responsible adult to drive home post procedure and be with patient first 24 hours after arriving home.  Patient reports no new symptoms concerning for COVID-19 in the past 10 days.  Reviewed procedure instructions with patient.

## 2021-12-18 ENCOUNTER — Encounter (HOSPITAL_COMMUNITY)
Admission: RE | Disposition: A | Discharge status: Home / Self Care | Payer: Self-pay | Source: Home / Self Care | Attending: Internal Medicine

## 2021-12-18 ENCOUNTER — Ambulatory Visit (HOSPITAL_COMMUNITY)
Admission: RE | Admit: 2021-12-18 | Discharge: 2021-12-18 | Disposition: A | Discharge status: Home / Self Care | Payer: BC Managed Care – PPO | Attending: Internal Medicine | Admitting: Internal Medicine

## 2021-12-18 ENCOUNTER — Encounter (HOSPITAL_COMMUNITY): Payer: Self-pay | Admitting: Internal Medicine

## 2021-12-18 ENCOUNTER — Other Ambulatory Visit: Payer: Self-pay

## 2021-12-18 DIAGNOSIS — I7121 Aneurysm of the ascending aorta, without rupture: Secondary | ICD-10-CM | POA: Diagnosis not present

## 2021-12-18 DIAGNOSIS — Q231 Congenital insufficiency of aortic valve: Secondary | ICD-10-CM | POA: Insufficient documentation

## 2021-12-18 HISTORY — PX: RIGHT/LEFT HEART CATH AND CORONARY ANGIOGRAPHY: CATH118266

## 2021-12-18 LAB — POCT I-STAT EG7
Acid-Base Excess: 0 mmol/L (ref 0.0–2.0)
Bicarbonate: 26 mmol/L (ref 20.0–28.0)
Calcium, Ion: 1.24 mmol/L (ref 1.15–1.40)
HCT: 40 % (ref 39.0–52.0)
Hemoglobin: 13.6 g/dL (ref 13.0–17.0)
O2 Saturation: 84 %
Potassium: 3.8 mmol/L (ref 3.5–5.1)
Sodium: 143 mmol/L (ref 135–145)
TCO2: 27 mmol/L (ref 22–32)
pCO2, Ven: 45.9 mmHg (ref 44–60)
pH, Ven: 7.36 (ref 7.25–7.43)
pO2, Ven: 51 mmHg — ABNORMAL HIGH (ref 32–45)

## 2021-12-18 LAB — POCT I-STAT 7, (LYTES, BLD GAS, ICA,H+H)
Acid-base deficit: 2 mmol/L (ref 0.0–2.0)
Bicarbonate: 23.9 mmol/L (ref 20.0–28.0)
Calcium, Ion: 1.24 mmol/L (ref 1.15–1.40)
HCT: 39 % (ref 39.0–52.0)
Hemoglobin: 13.3 g/dL (ref 13.0–17.0)
O2 Saturation: 100 %
Potassium: 3.8 mmol/L (ref 3.5–5.1)
Sodium: 142 mmol/L (ref 135–145)
TCO2: 25 mmol/L (ref 22–32)
pCO2 arterial: 42.3 mmHg (ref 32–48)
pH, Arterial: 7.36 (ref 7.35–7.45)
pO2, Arterial: 181 mmHg — ABNORMAL HIGH (ref 83–108)

## 2021-12-18 SURGERY — RIGHT/LEFT HEART CATH AND CORONARY ANGIOGRAPHY
Anesthesia: LOCAL

## 2021-12-18 MED ORDER — SODIUM CHLORIDE 0.9 % WEIGHT BASED INFUSION: 1.0000 mL/kg/h | INTRAVENOUS | Status: DC

## 2021-12-18 MED ORDER — LIDOCAINE HCL (PF) 1 % IJ SOLN
INTRAMUSCULAR | Status: AC
  Filled 2021-12-18: qty 30

## 2021-12-18 MED ORDER — IOHEXOL 350 MG/ML SOLN
INTRAVENOUS | Status: DC | PRN
  Administered 2021-12-18: 30 mL

## 2021-12-18 MED ORDER — HEPARIN SODIUM (PORCINE) 1000 UNIT/ML IJ SOLN
INTRAMUSCULAR | Status: AC
  Filled 2021-12-18: qty 10

## 2021-12-18 MED ORDER — MIDAZOLAM HCL 2 MG/2ML IJ SOLN
INTRAMUSCULAR | Status: AC
  Filled 2021-12-18: qty 2

## 2021-12-18 MED ORDER — MIDAZOLAM HCL 2 MG/2ML IJ SOLN
INTRAMUSCULAR | Status: DC | PRN
  Administered 2021-12-18: 1 mg via INTRAVENOUS

## 2021-12-18 MED ORDER — VERAPAMIL HCL 2.5 MG/ML IV SOLN
INTRAVENOUS | Status: AC
  Filled 2021-12-18: qty 2

## 2021-12-18 MED ORDER — HEPARIN (PORCINE) IN NACL 1000-0.9 UT/500ML-% IV SOLN
INTRAVENOUS | Status: AC
  Filled 2021-12-18: qty 500

## 2021-12-18 MED ORDER — SODIUM CHLORIDE 0.9% FLUSH: 3.0000 mL | Freq: Two times a day (BID) | INTRAVENOUS | Status: DC

## 2021-12-18 MED ORDER — HEPARIN SODIUM (PORCINE) 1000 UNIT/ML IJ SOLN
INTRAMUSCULAR | Status: DC | PRN
  Administered 2021-12-18: 5000 [IU] via INTRAVENOUS

## 2021-12-18 MED ORDER — FENTANYL CITRATE (PF) 100 MCG/2ML IJ SOLN
INTRAMUSCULAR | Status: AC
  Filled 2021-12-18: qty 2

## 2021-12-18 MED ORDER — ASPIRIN 81 MG PO CHEW: 81.0000 mg | CHEWABLE_TABLET | ORAL | Status: DC

## 2021-12-18 MED ORDER — VERAPAMIL HCL 2.5 MG/ML IV SOLN
INTRAVENOUS | Status: DC | PRN
  Administered 2021-12-18: 10 mL via INTRA_ARTERIAL

## 2021-12-18 MED ORDER — SODIUM CHLORIDE 0.9% FLUSH: 3.0000 mL | INTRAVENOUS | Status: DC | PRN

## 2021-12-18 MED ORDER — SODIUM CHLORIDE 0.9 % WEIGHT BASED INFUSION
3.0000 mL/kg/h | INTRAVENOUS | Status: AC
  Administered 2021-12-18: 3 mL/kg/h via INTRAVENOUS

## 2021-12-18 MED ORDER — LIDOCAINE HCL (PF) 1 % IJ SOLN
INTRAMUSCULAR | Status: DC | PRN
  Administered 2021-12-18: 5 mL

## 2021-12-18 MED ORDER — SODIUM CHLORIDE 0.9 % IV SOLN: 250.0000 mL | INTRAVENOUS | Status: DC | PRN

## 2021-12-18 MED ORDER — HEPARIN (PORCINE) IN NACL 1000-0.9 UT/500ML-% IV SOLN
INTRAVENOUS | Status: DC | PRN
  Administered 2021-12-18 (×2): 500 mL

## 2021-12-18 MED ORDER — FENTANYL CITRATE (PF) 100 MCG/2ML IJ SOLN
INTRAMUSCULAR | Status: DC | PRN
  Administered 2021-12-18: 25 ug via INTRAVENOUS

## 2021-12-18 SURGICAL SUPPLY — 13 items
BAND ZEPHYR COMPRESS 30 LONG (HEMOSTASIS) IMPLANT
CATH 5FR JL3.5 JR4 ANG PIG MP (CATHETERS) IMPLANT
CATH BALLN WEDGE 5F 110CM (CATHETERS) IMPLANT
CATH INFINITI 5FR JL4 (CATHETERS) IMPLANT
CATH INFINITI 5FR JL5 (CATHETERS) IMPLANT
GLIDESHEATH SLEND SS 6F .021 (SHEATH) IMPLANT
KIT HEART LEFT (KITS) ×1 IMPLANT
PACK CARDIAC CATHETERIZATION (CUSTOM PROCEDURE TRAY) ×1 IMPLANT
SHEATH GLIDE SLENDER 4/5FR (SHEATH) IMPLANT
TRANSDUCER W/STOPCOCK (MISCELLANEOUS) ×1 IMPLANT
TUBING CIL FLEX 10 FLL-RA (TUBING) ×1 IMPLANT
WIRE EMERALD 3MM-J .035X260CM (WIRE) IMPLANT
WIRE MICROINTRODUCER 60CM (WIRE) IMPLANT

## 2021-12-18 NOTE — Interval H&P Note (Signed)
History and Physical Interval Note:  12/18/2021 6:56 AM  Larry Campos.  has presented today for surgery, with the diagnosis of aortic valve insufficency.  The various methods of treatment have been discussed with the patient and family. After consideration of risks, benefits and other options for treatment, the patient has consented to  Procedure(s): RIGHT/LEFT HEART CATH AND CORONARY ANGIOGRAPHY (N/A) as a surgical intervention.  The patient's history has been reviewed, patient examined, no change in status, stable for surgery.  I have reviewed the patient's chart and labs.  Questions were answered to the patient's satisfaction.     Early Osmond

## 2021-12-18 NOTE — Progress Notes (Signed)
TR BAND REMOVAL  LOCATION:    right radial  DEFLATED PER PROTOCOL:    Yes.    TIME BAND OFF / DRESSING APPLIED: 12/18/21 at [facility] ARRIVAL:    Level 0  SITE AFTER BAND REMOVAL:    Level 0  CIRCULATION SENSATION AND MOVEMENT:    Within Normal Limits   Yes.    COMMENTS:

## 2021-12-20 LAB — POCT I-STAT EG7
Acid-base deficit: 1 mmol/L (ref 0.0–2.0)
Bicarbonate: 24.7 mmol/L (ref 20.0–28.0)
Calcium, Ion: 1.28 mmol/L (ref 1.15–1.40)
HCT: 40 % (ref 39.0–52.0)
Hemoglobin: 13.6 g/dL (ref 13.0–17.0)
O2 Saturation: 88 %
Potassium: 3.8 mmol/L (ref 3.5–5.1)
Sodium: 143 mmol/L (ref 135–145)
TCO2: 26 mmol/L (ref 22–32)
pCO2, Ven: 45.9 mmHg (ref 44–60)
pH, Ven: 7.339 (ref 7.25–7.43)
pO2, Ven: 58 mmHg — ABNORMAL HIGH (ref 32–45)

## 2021-12-27 ENCOUNTER — Encounter: Payer: BC Managed Care – PPO | Admitting: Surgery

## 2021-12-28 ENCOUNTER — Encounter: Payer: Self-pay | Admitting: Surgery

## 2021-12-28 ENCOUNTER — Institutional Professional Consult (permissible substitution): Payer: BC Managed Care – PPO | Admitting: Surgery

## 2021-12-28 VITALS — BP 137/74 | HR 84 | Resp 20 | Ht 69.0 in | Wt 137.8 lb

## 2021-12-28 DIAGNOSIS — I351 Nonrheumatic aortic (valve) insufficiency: Secondary | ICD-10-CM | POA: Insufficient documentation

## 2021-12-28 DIAGNOSIS — I712 Thoracic aortic aneurysm, without rupture, unspecified: Secondary | ICD-10-CM | POA: Diagnosis not present

## 2021-12-28 DIAGNOSIS — I340 Nonrheumatic mitral (valve) insufficiency: Secondary | ICD-10-CM

## 2021-12-28 NOTE — Progress Notes (Signed)
Cardiothoracic Surgery Consultation  PCP is [facility] Referring Provider is Orbie Pyohukkani, Arun K, MD  Chief Complaint  Patient presents with   Aortic Insuffiency   Thoracic Aortic Aneurysm   Mitral Regurgitation    New patient consultation, Cardiac Morph 7/9, ECHO 3/30, CATH 10/23, CTA chest 10/16    HPI:  The patient is a 26 year old gentleman with a history of a heart murmur since childhood who is now followed by Dr. Diona BrownerMcDowell.  He had an echocardiogram in February 2022 showing moderate aortic insufficiency with what appeared to be a unicuspid aortic valve.  There is a mean gradient of 23 mmHg across the aortic valve with a peak gradient of 41 mmHg.  There was no mitral regurgitation.  Left ventricular ejection fraction is 55 to 60%.  He had a follow-up 2D echo in March 2023 which showed moderate to severe aortic insufficiency with pressure half-time of 380 ms.  There was no clear flow reversal in the aortic arch.  The mean gradient across the aortic valve was 27 mmHg consistent with moderate aortic stenosis.  Left ventricular ejection fraction was 55 to 60% with an increase in left ventricular diastolic dimension to 5.25 cm compared to the previous echo when it was 4.99 cm.  A cardiac MRI on 09/01/2021 showed severe aortic insufficiency with a regurgitant fraction of 55%.  The aortic valve appeared to be bicuspid.  Ventricular ejection fraction was 55%.  Right ventricular size and function were normal.  The aortic root at the sinuses of Valsalva measured 4.3 cm and the ascending aorta measured 4.2 cm.  He subsequent underwent cardiac catheterization on 12/18/2021 showing normal coronary anatomy.  There is no obstructive coronary disease.  Right and left heart pressures were normal with a mean gradient of 31 mmHg across the aortic valve.  A CTA of the chest on 12/11/2021 showed a dilated aortic root with a diameter of 4.8 cm and an ascending aortic aneurysm measuring 5 cm in diameter.   The proximal aortic arch measured 3.3 cm and the distal arch 2.4 cm.  The descending thoracic aorta measured 2.3 cm.  There is no evidence of aortic dissection.  He is married and lives with his wife and child.  He works for [employer].  He is a non-smoker.  He reports occasional episodes of atypical chest discomfort.  He has had some episodes of lightheadedness but no syncope.  He does report some exertional shortness of breath but no fatigue.  He denies any peripheral edema.  Past Medical History:  Diagnosis Date   Seasonal allergies    Unicuspid aortic valve     Past Surgical History:  Procedure Laterality Date   2D echo     Echocardiogram done 08/20/19 showed an EF of > 55% withmoderate AI and a AV Mean Grad of 38 mm Hg. b.3/2023aortic stenosis with moderate to severe eccentric aortic regurgitation and EF of 55-60%   I & D EXTREMITY Left 12/06/2018   Procedure: IRRIGATION AND DEBRIDEMENT EXTREMITY;  Surgeon: Dominica SeverinGramig, William, MD;  Location: [facility] OR;  Service: Orthopedics;  Laterality: Left;   RIGHT/LEFT HEART CATH AND CORONARY ANGIOGRAPHY N/A 12/18/2021   Procedure: RIGHT/LEFT HEART CATH AND CORONARY ANGIOGRAPHY;  Surgeon: Orbie Pyohukkani, Arun K, MD;  Location: [facility] INVASIVE [facility] LAB;  Service: Cardiovascular;  Laterality: N/A;   TONSILLECTOMY AND ADENOIDECTOMY  12/07/2010   Procedure: TONSILLECTOMY AND ADENOIDECTOMY;  Surgeon: Darletta MollSui W Teoh;  Location: [facility];  Service: ENT;  Laterality: Bilateral;    Family History  Problem Relation Age of Onset   Healthy Mother    Healthy Father     Social History Social History   Tobacco Use   Smoking status: Never    Passive exposure: Never   Smokeless tobacco: Never  Vaping Use   Vaping Use: Every day   Substances: Nicotine, Flavoring  Substance Use Topics   Alcohol use: Yes    Comment: occ   Drug use: No    Current Outpatient Medications  Medication Sig Dispense Refill   cetirizine (ZYRTEC) 10 MG tablet Take 10 mg by mouth daily.     VITAMIN D PO  Take 2,000 Units by mouth daily.     No current facility-administered medications for this visit.    Allergies  Allergen Reactions   Amoxicillin Hives   Penicillins Itching    Did it involve swelling of the face/tongue/throat, SOB, or low BP? No Did it involve sudden or severe rash/hives, skin peeling, or any reaction on the inside of your mouth or nose? No Did you need to seek medical attention at a hospital or doctor's office? No When did it last happen?       If all above answers are "NO", may proceed with cephalosporin use.     Review of Systems  Constitutional:  Negative for activity change, chills, fatigue and fever.  HENT:  Negative for dental problem.        Sees his dentist regularly  Eyes: Negative.   Respiratory:  Positive for shortness of breath.   Cardiovascular:  Positive for chest pain. Negative for leg swelling.  Gastrointestinal: Negative.   Endocrine: Negative.   Genitourinary: Negative.   Musculoskeletal: Negative.   Allergic/Immunologic: Negative.   Neurological:  Positive for dizziness. Negative for syncope.  Hematological: Negative.   Psychiatric/Behavioral: Negative.      BP 137/74 (BP Location: Right Arm, Patient Position: Sitting, Cuff Size: Normal)   Pulse 84   Resp 20   Ht  (1.753 m)   Wt 137 lb 12.8 oz (62.5 kg)   SpO2 100% Comment: RA  BMI 20.35 kg/m  Physical Exam Constitutional:      Appearance: Normal appearance. He is normal weight.  HENT:     Head: Normocephalic and atraumatic.     Mouth/Throat:     Mouth: Mucous membranes are moist.     Pharynx: Oropharynx is clear.  Eyes:     Extraocular Movements: Extraocular movements intact.     Conjunctiva/sclera: Conjunctivae normal.     Pupils: Pupils are equal, round, and reactive to light.  Cardiovascular:     Rate and Rhythm: Normal rate and regular rhythm.     Pulses: Normal pulses.     Heart sounds: Murmur heard.     Comments: 3/6 systolic murmur along the right sternal  border.  2/6 diastolic murmur along the left lower sternal border. Pulmonary:     Effort: Pulmonary effort is normal.     Breath sounds: Normal breath sounds.  Abdominal:     General: Abdomen is flat. Bowel sounds are normal. There is no distension.     Palpations: Abdomen is soft.     Tenderness: There is no abdominal tenderness.  Musculoskeletal:        General: No swelling.     Cervical back: Normal range of motion and neck supple.  Skin:    General: Skin is warm and dry.  Neurological:     General: No focal deficit present.     Mental Status: He is  alert and oriented to person, place, and time.  Psychiatric:        Mood and Affect: Mood normal.        Behavior: Behavior normal.        Thought Content: Thought content normal.        Judgment: Judgment normal.      Diagnostic Tests:  ECHOCARDIOGRAM REPORT       Patient Name:   Larry Campos. Date of Exam: 05/25/2021  Medical Rec #:  [id number]            Height:       68.0 in  Accession #:    [id number]           Weight:       133.2 lb  Date of Birth:  1996-01-10            BSA:          1.719 m  Patient Age:    26 years             BP:           125/82 mmHg  Patient Gender: M                    HR:           95 bpm.  Exam Location:  [place]   Procedure: 2D Echo, Cardiac Doppler, Color Doppler, 3D Echo and Strain  Analysis   Indications:   Q24.8 (ICD-10-CM) - Unicuspid aortic valve    History:        Patient has prior history of Echocardiogram examinations,  most                 recent 04/13/2020. Aortic Valve Disease; Risk  Factors:Non-Smoker.    Sonographer:   San Jetty  Referring Phys: [id number] SAMUEL G MCDOWELL   IMPRESSIONS     1. Left ventricular ejection fraction, by estimation, is 55 to 60%. The  left ventricle has normal function. The left ventricle has no regional  wall motion abnormalities. Left ventricular diastolic parameters were  normal. The average left ventricular  global longitudinal  strain is -19.3 %. The global longitudinal strain is  normal.   2. Right ventricular systolic function is normal. The right ventricular  size is normal. There is normal pulmonary artery systolic pressure. The  estimated right ventricular systolic pressure is 27.8 mmHg.   3. The mitral valve is grossly normal. Trivial mitral valve  regurgitation.   4. The aortic valve is abnormal. Described as unicuspid previously and  does appear that way in some views, in others more bicuspid. Aortic valve  regurgitation is moderate to severe, eccentric (no clear flow reversal in  aortic arch). Moderate aortic  valve stenosis. Aortic regurgitation PHT measures 380 msec. Aortic valve  area, by VTI measures 1.32 cm. Aortic valve mean gradient measures 26.8  mmHg. Dimentiionless index 0.42.   5. Aortic dilatation noted. There is borderline dilatation of the aortic  root, measuring 39 mm.   6. The inferior vena cava is normal in size with greater than 50%  respiratory variability, suggesting right atrial pressure of 3 mmHg.   Comparison(s): Prior images reviewed side by side. Overall moderate aortic  stenosis and moderate to severe, eccentric aortic regurgitation.   FINDINGS   Left Ventricle: Left ventricular ejection fraction, by estimation, is 55  to 60%. The left ventricle has normal function. The left ventricle has no  regional wall motion  abnormalities. The average left ventricular global  longitudinal strain is -19.3 %.  The global longitudinal strain is normal. The left ventricular internal  cavity size was normal in size. There is borderline left ventricular  hypertrophy. Left ventricular diastolic parameters were normal.   Right Ventricle: The right ventricular size is normal. No increase in  right ventricular wall thickness. Right ventricular systolic function is  normal. There is normal pulmonary artery systolic pressure. The tricuspid  regurgitant velocity is 2.49 m/s, and   with an  assumed right atrial pressure of 3 mmHg, the estimated right  ventricular systolic pressure is 16.1 mmHg.   Left Atrium: Left atrial size was normal in size.   Right Atrium: Right atrial size was normal in size.   Pericardium: There is no evidence of pericardial effusion.   Mitral Valve: The mitral valve is grossly normal. Trivial mitral valve  regurgitation.   Tricuspid Valve: The tricuspid valve is grossly normal. Tricuspid valve  regurgitation is trivial.   Aortic Valve: The aortic valve is abnormal. Aortic valve regurgitation is  moderate to severe. Aortic regurgitation PHT measures 380 msec. Moderate  aortic stenosis is present. Aortic valve mean gradient measures 26.8 mmHg.  Aortic valve peak gradient  measures 49.1 mmHg. Aortic valve area, by VTI measures 1.32 cm.   Pulmonic Valve: The pulmonic valve was grossly normal. Pulmonic valve  regurgitation is trivial.   Aorta: Aortic dilatation noted. There is borderline dilatation of the  aortic root, measuring 39 mm.   Venous: The inferior vena cava is normal in size with greater than 50%  respiratory variability, suggesting right atrial pressure of 3 mmHg.   IAS/Shunts: No atrial level shunt detected by color flow Doppler.     LEFT VENTRICLE  PLAX 2D  LVIDd:         5.25 cm      Diastology  LVIDs:         3.56 cm      LV e' medial:    7.80 cm/s  LV PW:         1.13 cm      LV E/e' medial:  12.9  LV IVS:        0.84 cm      LV e' lateral:   14.50 cm/s  LVOT diam:     2.00 cm      LV E/e' lateral: 7.0  LV SV:         84  LV SV Index:   49           2D Longitudinal Strain  LVOT Area:     3.14 cm     2D Strain GLS Avg:     -19.3 %    LV Volumes (MOD)  LV vol d, MOD A2C: 169.0 ml  LV vol d, MOD A4C: 125.0 ml  LV vol s, MOD A2C: 47.7 ml  LV vol s, MOD A4C: 49.8 ml  LV SV MOD A2C:     121.3 ml  LV SV MOD A4C:     125.0 ml  LV SV MOD BP:      104.0 ml   RIGHT VENTRICLE  RV Basal diam:  2.86 cm  RV Mid diam:    2.22  cm  RV S prime:     13.50 cm/s  TAPSE (M-mode): 2.4 cm   LEFT ATRIUM             Index        RIGHT ATRIUM  Index  LA Vol (A2C):   38.6 ml 22.45 ml/m  RA Area:     9.20 cm  LA Vol (A4C):   29.6 ml 17.21 ml/m  RA Volume:   16.90 ml 9.83 ml/m  LA Biplane Vol: 33.8 ml 19.66 ml/m   AORTIC VALVE                     PULMONIC VALVE  AV Area (Vmax):    1.10 cm      PV Vmax:       1.14 m/s  AV Area (Vmean):   1.16 cm      PV Peak grad:  5.2 mmHg  AV Area (VTI):     1.32 cm  AV Vmax:           350.20 cm/s  AV Vmean:          236.800 cm/s  AV VTI:            0.632 m  AV Peak Grad:      49.1 mmHg  AV Mean Grad:      26.8 mmHg  LVOT Vmax:         122.67 cm/s  LVOT Vmean:        87.633 cm/s  LVOT VTI:          0.266 m  LVOT/AV VTI ratio: 0.42  AI PHT:            380 msec  AR Vena Contracta: 0.66 cm    AORTA  Ao Root diam: 3.90 cm  Ao Asc diam:  3.80 cm  Ao Arch diam: 2.8 cm   MITRAL VALVE                TRICUSPID VALVE  MV Area (PHT): 3.40 cm     TR Peak grad:   24.8 mmHg  MV Decel Time: 223 msec     TR Vmax:        249.00 cm/s  MV E velocity: 101.00 cm/s  MV A velocity: 104.00 cm/s  SHUNTS  MV E/A ratio:  0.97         Systemic VTI:  0.27 m                              Systemic Diam: 2.00 cm   Nona Dell MD  Electronically signed by Nona Dell MD  Signature Date/Time: 05/26/2021/12:32:07 PM       Narrative & Impression  CLINICAL DATA:  Aortic insufficiency   EXAM: CARDIAC MRI   TECHNIQUE: The patient was scanned on a 1.5 Tesla GE magnet. A dedicated cardiac coil was used. Functional imaging was done using Fiesta sequences. 2,3, and 4 chamber views were done to assess for RWMA's. Modified Simpson's rule using a short axis stack was used to calculate an ejection fraction on a dedicated work [employer]. Flow sequences were done. The patient received 8 cc of Gadavist. After 10 minutes inversion recovery sequences were used to assess  for infiltration and scar tissue.   FINDINGS: Limited images of the lung fields showed no gross abnormalities.   The ascending aorta was dilated to 4.2 cm, the aortic root at sinuses of valsalva measured 4.3 cm. The left ventricle is mild to moderately dilated with normal wall thickness. Normal wall motion, EF 55%. Normal right ventricular size and systolic function, EF 52%. Normal left and right atrial sizes. Visually, I did not  see significant mitral regurgitation though calculated mitral regurgitant fraction by LVSV and aortic forward volume would be about 20%. The aortic valve was bicuspid. There was a degree of stenosis present, this would be better-delineated by echo. There was severe aortic insufficiency with regurgitant fraction calculated 55%.   On delayed enhancement imaging, there was no myocardial late gadolinium enhancement (LGE) noted.   MEASUREMENTS: MEASUREMENTS LVEDV 228 mL   LVEDVi 133 mL/m2   LVSV 103 mL   LVEF 55%   RVEDV 167 mL RVEDVi 97 mL/m2   RVSV 86 mL   RVEF 52%   Aortic forward volume 82 mL   Aortic reverse volume 46 mL   Aortic regurgitant fraction 55%   IMPRESSION: 1.  Mild to moderate LV dilation with EF 55%, normal wall motion.   2.  Normal RV size and systolic function, EF 52%.   3.  Mildly dilated aortic root and ascending aorta.   4. Bicuspid aortic valve with severe aortic regurgitation (regurgitant fraction 55%). There was a degree of aortic stenosis also present better assessed by echo.   5. Visually there did not appear to be significant mitral regurgitation though calculated regurgitant fraction was 20%. Would confirm presence or absence by echo.   6.  No delayed enhancement.   Dalton Mclean     Electronically Signed   By: Marca Ancona M.D.   On: 09/03/2021 10:22     Physicians  Panel Physicians Referring Physician Case Authorizing Physician  Orbie Pyo, MD (Primary)     Procedures  RIGHT/LEFT  HEART CATH AND CORONARY ANGIOGRAPHY   Conclusion  1.  Normal right dominant coronary circulation. 2.  Fick cardiac output of 9.48 L/min and Fick cardiac index of 5.4 L/min/m; mean right atrial pressure of 2 mmHg, mean wedge pressure of 9 mmHg, mean PA pressure of 13 mmHg, and LVEDP of 9 mmHg. 3.  Mean gradient across the aortic valve of 31 mmHg.   Recommendation: Cardiothoracic surgery evaluation.   Indications  Aneurysm of ascending aorta without rupture (HCC) [I71.21 (ICD-10-CM)]   Procedural Details  Technical Details The patient is a 26 year old male with a history of a bicuspid aortic valve with severe aortic insufficiency and thoracic ascending aortic aneurysm who is referred for a preprocedural assessment prior to aortic valve replacement and ascending aortic aneurysm repair.  After obtaining consent the patient brought to the cardiac catheterization laboratory and prepped and draped in sterile fashion.  Xylocaine was used to anesthetize the area around t a previously placed antecubital IV in the right radial site.  The previously placed antecubital IV was exchanged for a 5 [place] Terumo glide sheath.  Right radial access was obtained and a 6 French femoral glide sheath was placed.  5000 units heparin and 5 mg verapamil were administered through the sheath.  Right heart catheterization was performed with a 5 [place] balloontipped catheter.  Coronary angiography was performed with a 5 [place] JR4 catheter and a 5 [place] JL 4 catheter.  Left heart catheterization performed with a 5 French pigtail catheter.  There were no acute complications.  A TR band was placed and manual pressure applied to the antecubital site. Estimated blood loss <50 mL.   During this procedure medications were administered to achieve and maintain moderate conscious sedation while the patient's heart rate, blood pressure, and oxygen saturation were continuously monitored and I was present face-to-face 100% of this  time.   Medications (Filter: Administrations occurring from 0932 to 1023 on 12/18/21) Heparin (Porcine) in  NaCl 1000-0.9 UT/500ML-% SOLN (mL)  Total volume: 1,000 mL Date/Time Rate/Dose/Volume Action   12/18/21 0935 500 mL Given   0935 500 mL Given   fentaNYL (SUBLIMAZE) injection (mcg)  Total dose: 25 mcg Date/Time Rate/Dose/Volume Action   12/18/21 0943 25 mcg Given   midazolam (VERSED) injection (mg)  Total dose: 1 mg Date/Time Rate/Dose/Volume Action   12/18/21 0943 1 mg Given   lidocaine (PF) (XYLOCAINE) 1 % injection (mL)  Total volume: 5 mL Date/Time Rate/Dose/Volume Action   12/18/21 0952 5 mL Given   Radial Cocktail/Verapamil only (mL)  Total volume: 10 mL Date/Time Rate/Dose/Volume Action   12/18/21 0955 10 mL Given   heparin sodium (porcine) injection (Units)  Total dose: 5,000 Units Date/Time Rate/Dose/Volume Action   12/18/21 0955 5,000 Units Given   iohexol (OMNIPAQUE) 350 MG/ML injection (mL)  Total volume: 30 mL Date/Time Rate/Dose/Volume Action   12/18/21 1014 30 mL Given    Sedation Time  Sedation Time Physician-1: 29 minutes 18 seconds Contrast  Medication Name Total Dose  iohexol (OMNIPAQUE) 350 MG/ML injection 30 mL   Radiation/Fluoro  Fluoro time: 5.2 (min) DAP: 5923 (mGycm2) Cumulative Air Kerma: 86 (mGy) Complications  Complications documented before study signed (12/18/2021 10:24 AM)   No complications were associated with this study.  Documented by Ancil Linsey, RT - 12/18/2021 10:13 AM     Coronary Findings  Diagnostic Dominance: Right No diagnostic findings have been documented. Intervention  No interventions have been documented. Coronary Diagrams  Diagnostic Dominance: Right  Intervention   Implants   No implant documentation for this case.   Syngo Images   Show images for CARDIAC CATHETERIZATION Images on Long Term Storage   Show images for Butch, Otterson to Procedure Log  Procedure Log     Hemo Data  Flowsheet Row Most Recent Value  Fick Cardiac Output 9.48 L/min  Fick Cardiac Output Index 5.4 (L/min)/BSA  Aortic Mean Gradient 30.92 mmHg  Aortic Peak Gradient 26.2 mmHg  Aortic Valve Area 1.94  Aortic Value Area Index 1.1 cm2/BSA  RA A Wave 7 mmHg  RA V Wave 4 mmHg  RA Mean 2 mmHg  RV Systolic Pressure 23 mmHg  RV Diastolic Pressure -6 mmHg  RV EDP 4 mmHg  PA Systolic Pressure 22 mmHg  PA Diastolic Pressure 7 mmHg  PA Mean 13 mmHg  PW A Wave 12 mmHg  PW V Wave 16 mmHg  PW Mean 9 mmHg  AO Systolic Pressure 103 mmHg  AO Diastolic Pressure 55 mmHg  AO Mean 76 mmHg  LV Systolic Pressure 130 mmHg  LV Diastolic Pressure 0 mmHg  LV EDP 13 mmHg  AOp Systolic Pressure 106 mmHg  AOp Diastolic Pressure 55 mmHg  AOp Mean Pressure 78 mmHg  LVp Systolic Pressure 127 mmHg  LVp Diastolic Pressure -8 mmHg  LVp EDP Pressure 9 mmHg  QP/QS 0.75  TPVR Index 3.21 HRUI  TSVR Index 14.08 HRUI  PVR SVR Ratio 0.07  TPVR/TSVR Ratio 0.23    Narrative & Impression  CLINICAL DATA:  History of bicuspid aortic valve with associated aortic valvular insufficiency and aneurysmal dilatation of the ascending thoracic aorta and aortic root by prior imaging.   EXAM: CT ANGIOGRAPHY CHEST WITH CONTRAST   TECHNIQUE: Multidetector CT imaging of the chest was performed using the standard protocol during bolus administration of intravenous contrast. Multiplanar CT image reconstructions and MIPs were obtained to evaluate the vascular anatomy. A gated acquisition was performed for optimal evaluation of the aortic  root and ascending thoracic aorta.   RADIATION DOSE REDUCTION: This exam was performed according to the departmental dose-optimization program which includes automated exposure control, adjustment of the mA and/or kV according to patient size and/or use of iterative reconstruction technique.   CONTRAST:  80mL OMNIPAQUE IOHEXOL 350 MG/ML SOLN   COMPARISON:  Cardiac MRI study  on 09/01/2021   FINDINGS: Cardiovascular: The aortic valve is thickened, consistent with known bicuspid morphology. Dilated aortic root measures approximately 4.6-4.8 cm in estimated diameter at the level of the sinuses of Valsalva. The ascending thoracic aorta is significantly dilated and measures up to approximately 5 cm in maximum diameter. The proximal arch measures 3.3 cm and the distal arch 2.4 cm. The descending thoracic aorta measures 2.3 cm. No evidence of aortic dissection. Visualized proximal great vessels demonstrate normal patency and bovine branching anatomy.   The heart size is normal. There may be some degree of left ventricular dilatation. No pericardial fluid identified. Central pulmonary arteries are normal in caliber.   Mediastinum/Nodes: No enlarged mediastinal, hilar, or axillary lymph nodes. Thyroid gland, trachea, and esophagus demonstrate no significant findings.   Lungs/Pleura: There is no evidence of pulmonary edema, consolidation, pneumothorax, nodule or pleural fluid.   Upper Abdomen: No acute abnormality.   Musculoskeletal: No chest wall abnormality. No acute or significant osseous findings.   Review of the MIP images confirms the above findings.   IMPRESSION: 1. Thickened aortic valve, consistent with known bicuspid morphology. 2. Dilated aortic root measuring approximately 4.6-4.8 cm in estimated diameter at the level of the sinuses of Valsalva. The ascending thoracic aorta is significantly dilated and measures up to approximately 5 cm in maximum diameter. Ascending thoracic aortic aneurysm. Recommend semi-annual imaging followup by CTA or MRA and referral to cardiothoracic surgery if not already obtained. This recommendation follows 2010 ACCF/AHA/AATS/ACR/ASA/SCA/SCAI/SIR/STS/SVM Guidelines for the Diagnosis and Management of Patients With Thoracic Aortic Disease. Circulation. 2010; 121: W098-J191. Aortic aneurysm NOS (ICD10-I71.9) 3. No  evidence of aortic dissection. 4. Possible left ventricular dilatation.   Aortic aneurysm NOS (ICD10-I71.9).     Electronically Signed   By: Irish Lack M.D.   On: 12/11/2021 14:30      Result History   Impression:  This 26 year old gentleman has a bicuspid aortic valve with moderate aortic stenosis and severe aortic insufficiency with a 4.8 cm aortic root and 5 cm fusiform ascending aortic aneurysm.  He has NYHA class II symptoms of exertional shortness of breath as well as occasional episodes of dizziness and chest discomfort.  His echocardiogram shows an increase in the left ventricular diastolic dimension.  I think it is time to proceed with replacement of his aortic valve and ascending aortic aneurysm to prevent left ventricular deterioration and aortic dissection.  I have reviewed the studies with him and his wife.  I recommended Bentall procedure using a mechanical valve conduit with replacement of the aorta to the proximal aortic arch.  This will require a period of circulatory arrest.  He has no contraindications to anticoagulation and feels that he could be compliant with taking Coumadin. I discussed the operative procedure with the patient and his wife including alternatives, benefits and risks; including but not limited to bleeding, blood transfusion, infection, stroke, myocardial infarction,heart block requiring a permanent pacemaker, organ dysfunction, and death.  [name] understands and agrees to proceed.    Plan:  He and his wife are going to discuss the timing of surgery and will call us back tomorrow to set a date.  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  [name], MD [facility] [phone]

## 2021-12-29 ENCOUNTER — Encounter: Payer: Self-pay | Admitting: *Deleted

## 2021-12-29 ENCOUNTER — Other Ambulatory Visit: Payer: Self-pay | Admitting: *Deleted

## 2021-12-29 DIAGNOSIS — I712 Thoracic aortic aneurysm, without rupture, unspecified: Secondary | ICD-10-CM

## 2021-12-29 DIAGNOSIS — I351 Nonrheumatic aortic (valve) insufficiency: Secondary | ICD-10-CM

## 2021-12-29 DIAGNOSIS — I340 Nonrheumatic mitral (valve) insufficiency: Secondary | ICD-10-CM

## 2021-12-29 NOTE — Anesthesia Preprocedure Evaluation (Addendum)
Anesthesia Evaluation  Patient identified by MRN, date of birth, ID band Patient awake    Reviewed: Allergy & Precautions, NPO status , Patient's Chart, lab work & pertinent test results  Airway Mallampati: I  TM Distance: >3 FB Neck ROM: Full    Dental no notable dental hx. (+) Dental Advisory Given, Teeth Intact   Pulmonary neg pulmonary ROS   Pulmonary exam normal breath sounds clear to auscultation       Cardiovascular negative cardio ROS  Rhythm:Regular Rate:Normal + Systolic murmurs and + Diastolic murmurs Echo 07/8125  1. Left ventricular ejection fraction, by estimation, is 55 to 60%. The left ventricle has normal function. The left ventricle has no regional wall motion abnormalities. Left ventricular diastolic parameters were normal. The average left ventricular global longitudinal strain is -19.3 %. The global longitudinal strain is normal.   2. Right ventricular systolic function is normal. The right ventricular size is normal. There is normal pulmonary artery systolic pressure. The estimated right ventricular systolic pressure is 51.7 mmHg.   3. The mitral valve is grossly normal. Trivial mitral valve regurgitation.   4. The aortic valve is abnormal. Described as unicuspid previously and does appear that way in some views, in others more bicuspid. Aortic valve regurgitation is moderate to severe, eccentric (no clear flow reversal in aortic arch). Moderate aortic valve stenosis. Aortic regurgitation PHT measures 380 msec. Aortic valve area, by VTI measures 1.32 cm. Aortic valve mean gradient measures 26.8 mmHg. Dimentiionless index 0.42.   5. Aortic dilatation noted. There is borderline dilatation of the aortic root, measuring 39 mm.   6. The inferior vena cava is normal in size with greater than 50% respiratory variability, suggesting right atrial pressure of 3 mmHg.   Comparison(s): Prior images reviewed side by side. Overall  moderate aortic stenosis and moderate to severe, eccentric aortic regurgitation.     Neuro/Psych negative neurological ROS     GI/Hepatic negative GI ROS, Neg liver ROS,,,  Endo/Other  negative endocrine ROS    Renal/GU negative Renal ROS     Musculoskeletal negative musculoskeletal ROS (+)    Abdominal  (+) - obese  Peds  Hematology negative hematology ROS (+)   Anesthesia Other Findings   Reproductive/Obstetrics                             Anesthesia Physical Anesthesia Plan  ASA: 4  Anesthesia Plan: General   Post-op Pain Management:    Induction: Intravenous  PONV Risk Score and Plan: 3 and Treatment may vary due to age or medical condition and Midazolam  Airway Management Planned: Oral ETT  Additional Equipment: Arterial line, CVP, PA Cath, TEE and Ultrasound Guidance Line Placement  Intra-op Plan: Utilization Of Total Body Hypothermia per surgeon request and Delibrate Circulatory arrest per surgeon request  Post-operative Plan: Post-operative intubation/ventilation  Informed Consent: I have reviewed the patients History and Physical, chart, labs and discussed the procedure including the risks, benefits and alternatives for the proposed anesthesia with the patient or authorized representative who has indicated his/her understanding and acceptance.     Dental advisory given  Plan Discussed with: CRNA  Anesthesia Plan Comments: (Only One arterial line per Dr. Cyndia Bent request (12/29/21) Myra Gianotti, PA-C. )       Anesthesia Quick Evaluation

## 2022-01-01 ENCOUNTER — Telehealth: Payer: Self-pay

## 2022-01-01 NOTE — Telephone Encounter (Signed)
FMLA paperwork completed per patient request. Patient to pick-up original. DOS 01/08/22.

## 2022-01-03 ENCOUNTER — Encounter (HOSPITAL_COMMUNITY): Payer: Self-pay

## 2022-01-03 NOTE — Progress Notes (Signed)
PCP - Dr Selinda Flavin Cardiologist - Dr Nona Dell  Chest x-ray - 01/04/22 EKG - 01/04/22 Stress Test - *** ECHO - 05/25/21 Cardiac Cath - 12/18/21  ICD Pacemaker/Loop - n/a  Sleep Study -  n/a CPAP - none  Anesthesia review: Yes  STOP now taking any Aspirin (unless otherwise instructed by your surgeon), Aleve, Naproxen, Ibuprofen, Motrin, Advil, Goody's, BC's, all herbal medications, fish oil, and all vitamins.   Coronavirus Screening Do you have any of the following symptoms:  Cough yes/no: No Fever (>100.66F)  yes/no: No Runny nose yes/no: No Sore throat yes/no: No Difficulty breathing/shortness of breath  yes/no: No  Have you traveled in the last 14 days and where? yes/no: No  Patient verbalized understanding of instructions that were given to them at the PAT appointment. Patient was also instructed that they will need to review over the PAT instructions again at home before surgery.

## 2022-01-03 NOTE — Progress Notes (Signed)
Surgical Instructions    Your procedure is scheduled on Monday, 01/08/22.  Report to [facility] Main Entrance "A" at 5:30 A.M., then check in with the Admitting office.  Call this number if you have problems the morning of surgery:  [phone]   If you have any questions prior to your surgery date call [phone]: Open Monday-Friday 8am-4pm If you experience any cold or flu symptoms such as cough, fever, chills, shortness of breath, etc. between now and your scheduled surgery, please notify us at the above number     Remember:  Do not eat or drink after midnight the night before your surgery     Take these medicines the morning of surgery with A SIP OF WATER:  cetirizine (ZYRTEC)   As of today, STOP taking any Aspirin (unless otherwise instructed by your surgeon) Aleve, Naproxen, Ibuprofen, Motrin, Advil, Goody's, BC's, all herbal medications, fish oil, and all vitamins.           Do not wear jewelry or makeup. Do not wear lotions, powders, cologne or deodorant. Men may shave face and neck. Do not bring valuables to the hospital. Do not wear nail polish, gel polish, artificial nails, or any other type of covering on natural nails (fingers and toes) If you have artificial nails or gel coating that need to be removed by a nail salon, please have this removed prior to surgery. Artificial nails or gel coating may interfere with anesthesia's ability to adequately monitor your vital signs.  [facility] is not responsible for any belongings or valuables.    Do NOT Smoke (Tobacco/Vaping)  24 hours prior to your procedure  If you use a CPAP at night, you may bring your mask for your overnight stay.   Contacts, glasses, hearing aids, dentures or partials may not be worn into surgery, please bring cases for these belongings   For patients admitted to the hospital, discharge time will be determined by your treatment team.   Patients discharged the day of surgery will not be allowed to  drive home, and someone needs to stay with them for 24 hours.   SURGICAL WAITING ROOM VISITATION Patients having surgery or a procedure may have no more than 2 support people in the waiting area - these visitors may rotate.   Children under the age of 67 must have an adult with them who is not the patient. If the patient needs to stay at the hospital during part of their recovery, the visitor guidelines for inpatient rooms apply. Pre-op nurse will coordinate an appropriate time for 1 support person to accompany patient in pre-op.  This support person may not rotate.   Please refer to [email or web address] for the visitor guidelines for Inpatients (after your surgery is over and you are in a regular room).    Special instructions:    Oral Hygiene is also important to reduce your risk of infection.  Remember - BRUSH YOUR TEETH THE MORNING OF SURGERY WITH YOUR REGULAR TOOTHPASTE   [facility]- Preparing For Surgery  Before surgery, you can play an important role. Because skin is not sterile, your skin needs to be as free of germs as possible. You can reduce the number of germs on your skin by washing with CHG (chlorahexidine gluconate) Soap before surgery.  CHG is an antiseptic cleaner which kills germs and bonds with the skin to continue killing germs even after washing.     Please do not use if you have an allergy to CHG or antibacterial  soaps. If your skin becomes reddened/irritated stop using the CHG.  Do not shave (including legs and underarms) for at least 48 hours prior to first CHG shower. It is OK to shave your face.  Please follow these instructions carefully.     Shower the NIGHT BEFORE SURGERY and the MORNING OF SURGERY with CHG Soap.   If you chose to wash your hair, wash your hair first as usual with your normal shampoo. After you shampoo, rinse your hair and body thoroughly to remove the shampoo.  Then [employer] and genitals  (private parts) with your normal soap and rinse thoroughly to remove soap.  After that Use CHG Soap as you would any other liquid soap. You can apply CHG directly to the skin and wash gently with a scrungie or a clean washcloth.   Apply the CHG Soap to your body ONLY FROM THE NECK DOWN.  Do not use on open wounds or open sores. Avoid contact with your eyes, ears, mouth and genitals (private parts). Wash Face and genitals (private parts)  with your normal soap.   Wash thoroughly, paying special attention to the area where your surgery will be performed.  Thoroughly rinse your body with warm water from the neck down.  DO NOT shower/wash with your normal soap after using and rinsing off the CHG Soap.  Pat yourself dry with a CLEAN TOWEL.  Wear CLEAN PAJAMAS to bed the night before surgery  Place CLEAN SHEETS on your bed the night before your surgery  DO NOT SLEEP WITH PETS.   Day of Surgery: Take a shower with CHG soap. Wear Clean/Comfortable clothing the morning of surgery Do not apply any deodorants/lotions.   Remember to brush your teeth WITH YOUR REGULAR TOOTHPASTE.    If you received a COVID test during your pre-op visit, it is requested that you wear a mask when out in public, stay away from anyone that may not be feeling well, and notify your surgeon if you develop symptoms. If you have been in contact with anyone that has tested positive in the last 10 days, please notify your surgeon.    Please read over the following fact sheets that you were given.

## 2022-01-04 ENCOUNTER — Encounter (HOSPITAL_COMMUNITY)
Admission: RE | Admit: 2022-01-04 | Discharge: 2022-01-04 | Disposition: A | Discharge status: Home / Self Care | Payer: BC Managed Care – PPO | Source: Ambulatory Visit | Attending: Surgery | Admitting: Surgery

## 2022-01-04 ENCOUNTER — Telehealth: Payer: Self-pay

## 2022-01-04 ENCOUNTER — Encounter (HOSPITAL_COMMUNITY): Payer: Self-pay

## 2022-01-04 ENCOUNTER — Ambulatory Visit (HOSPITAL_COMMUNITY)
Admission: RE | Admit: 2022-01-04 | Discharge: 2022-01-04 | Disposition: A | Discharge status: Home / Self Care | Payer: BC Managed Care – PPO | Source: Ambulatory Visit | Attending: Surgery | Admitting: Surgery

## 2022-01-04 ENCOUNTER — Other Ambulatory Visit: Payer: Self-pay

## 2022-01-04 VITALS — BP 127/68 | HR 75 | Temp 97.7°F | Resp 17 | Ht 69.0 in | Wt 136.1 lb

## 2022-01-04 DIAGNOSIS — I712 Thoracic aortic aneurysm, without rupture, unspecified: Secondary | ICD-10-CM | POA: Diagnosis not present

## 2022-01-04 DIAGNOSIS — Z1152 Encounter for screening for COVID-19: Secondary | ICD-10-CM | POA: Diagnosis not present

## 2022-01-04 DIAGNOSIS — Z01818 Encounter for other preprocedural examination: Secondary | ICD-10-CM | POA: Diagnosis not present

## 2022-01-04 DIAGNOSIS — I351 Nonrheumatic aortic (valve) insufficiency: Secondary | ICD-10-CM | POA: Diagnosis not present

## 2022-01-04 LAB — COMPREHENSIVE METABOLIC PANEL
ALT: 11 U/L (ref 0–44)
AST: 18 U/L (ref 15–41)
Albumin: 4.2 g/dL (ref 3.5–5.0)
Alkaline Phosphatase: 42 U/L (ref 38–126)
Anion gap: 6 (ref 5–15)
BUN: 16 mg/dL (ref 6–20)
CO2: 21 mmol/L — ABNORMAL LOW (ref 22–32)
Calcium: 9.3 mg/dL (ref 8.9–10.3)
Chloride: 111 mmol/L (ref 98–111)
Creatinine, Ser: 1.01 mg/dL (ref 0.61–1.24)
GFR, Estimated: >60 mL/min (ref >60)
Glucose, Bld: 96 mg/dL (ref 70–99)
Potassium: 3.8 mmol/L (ref 3.5–5.1)
Sodium: 138 mmol/L (ref 135–145)
Total Bilirubin: 0.9 mg/dL (ref 0.3–1.2)
Total Protein: 7.1 g/dL (ref 6.5–8.1)

## 2022-01-04 LAB — CBC
HCT: 44.4 % (ref 39.0–52.0)
Hemoglobin: 14.7 g/dL (ref 13.0–17.0)
MCH: 30.4 pg (ref 26.0–34.0)
MCHC: 33.1 g/dL (ref 30.0–36.0)
MCV: 91.7 fL (ref 80.0–100.0)
Platelets: 204 10*3/uL (ref 150–400)
RBC: 4.84 MIL/uL (ref 4.22–5.81)
RDW: 11.9 % (ref 11.5–15.5)
WBC: 6.9 10*3/uL (ref 4.0–10.5)
nRBC: 0 % (ref 0.0–0.2)

## 2022-01-04 LAB — HEMOGLOBIN A1C
Hgb A1c MFr Bld: 4.9 % (ref 4.8–5.6)
Mean Plasma Glucose: 93.93 mg/dL

## 2022-01-04 LAB — URINALYSIS, ROUTINE W REFLEX MICROSCOPIC
Bilirubin Urine: NEGATIVE
Glucose, UA: NEGATIVE mg/dL
Hgb urine dipstick: NEGATIVE
Ketones, ur: NEGATIVE mg/dL
Leukocytes,Ua: NEGATIVE
Nitrite: NEGATIVE
Protein, ur: NEGATIVE mg/dL
Specific Gravity, Urine: 1.005 (ref 1.005–1.030)
pH: 6 (ref 5.0–8.0)

## 2022-01-04 LAB — BLOOD GAS, ARTERIAL
Acid-Base Excess: 0.5 mmol/L (ref 0.0–2.0)
Bicarbonate: 24.6 mmol/L (ref 20.0–28.0)
Drawn by: 58193
O2 Saturation: 100 %
Patient temperature: 37
pCO2 arterial: 37 mmHg (ref 32–48)
pH, Arterial: 7.43 (ref 7.35–7.45)
pO2, Arterial: 129 mmHg — ABNORMAL HIGH (ref 83–108)

## 2022-01-04 LAB — PROTIME-INR
INR: 1.1 (ref 0.8–1.2)
Prothrombin Time: 14.5 seconds (ref 11.4–15.2)

## 2022-01-04 LAB — APTT: aPTT: 33 seconds (ref 24–36)

## 2022-01-04 LAB — SURGICAL PCR SCREEN
MRSA, PCR: NEGATIVE
Staphylococcus aureus: POSITIVE — AB

## 2022-01-04 LAB — SARS CORONAVIRUS 2 BY RT PCR: SARS Coronavirus 2 by RT PCR: NEGATIVE

## 2022-01-04 NOTE — Progress Notes (Signed)
VASCULAR LAB    Pre CABG Dopplers have been performed.  See CV proc for preliminary results.   Glennie Rodda, RVT 01/04/2022, 10:32 AM

## 2022-01-04 NOTE — Telephone Encounter (Signed)
FLMA form completed and faxed to [employer] @ [phone]. Beginning LOA 01/08/22 through 04/02/2022. Forms mailed to pt's home address.

## 2022-01-04 NOTE — Progress Notes (Signed)
Dr Laneta Simmers informed of positive PCR results for Staph via IB message.  I called patient and informed him of positive PCR Staph result.  Mupirocin 2% Ointment called in to the patient's pharmacy and verified that they had medication in stock.  Patient will pick up medication from pharmacy.

## 2022-01-05 MED ORDER — TRANEXAMIC ACID (OHS) PUMP PRIME SOLUTION
2.0000 mg/kg | INTRAVENOUS | Status: DC
  Filled 2022-01-05: qty 1.23

## 2022-01-05 MED ORDER — DEXMEDETOMIDINE HCL IN NACL 400 MCG/100ML IV SOLN
0.1000 ug/kg/h | INTRAVENOUS | Status: AC
  Administered 2022-01-08: .4 ug/kg/h via INTRAVENOUS
  Filled 2022-01-05: qty 100

## 2022-01-05 MED ORDER — NOREPINEPHRINE 4 MG/250ML-% IV SOLN
0.0000 ug/min | INTRAVENOUS | Status: DC
  Filled 2022-01-05: qty 250

## 2022-01-05 MED ORDER — VANCOMYCIN HCL 1250 MG/250ML IV SOLN
1250.0000 mg | INTRAVENOUS | Status: AC
  Administered 2022-01-08: 1250 mg via INTRAVENOUS
  Filled 2022-01-05: qty 250

## 2022-01-05 MED ORDER — PHENYLEPHRINE HCL-NACL 20-0.9 MG/250ML-% IV SOLN
30.0000 ug/min | INTRAVENOUS | Status: AC
  Administered 2022-01-08: 35 ug/min via INTRAVENOUS
  Administered 2022-01-08: 25 ug/min via INTRAVENOUS
  Filled 2022-01-05: qty 250

## 2022-01-05 MED ORDER — INSULIN REGULAR(HUMAN) IN NACL 100-0.9 UT/100ML-% IV SOLN
INTRAVENOUS | Status: AC
  Administered 2022-01-08: .5 [IU]/h via INTRAVENOUS
  Filled 2022-01-05: qty 100

## 2022-01-05 MED ORDER — PLASMA-LYTE A IV SOLN
INTRAVENOUS | Status: DC
  Filled 2022-01-05: qty 2.5

## 2022-01-05 MED ORDER — TRANEXAMIC ACID 1000 MG/10ML IV SOLN
1.5000 mg/kg/h | INTRAVENOUS | Status: AC
  Administered 2022-01-08: 1.5 mg/kg/h via INTRAVENOUS
  Filled 2022-01-05: qty 25

## 2022-01-05 MED ORDER — MILRINONE LACTATE IN DEXTROSE 20-5 MG/100ML-% IV SOLN
0.3000 ug/kg/min | INTRAVENOUS | Status: DC
  Filled 2022-01-05: qty 100

## 2022-01-05 MED ORDER — CEFAZOLIN SODIUM-DEXTROSE 2-4 GM/100ML-% IV SOLN
2.0000 g | INTRAVENOUS | Status: AC
  Administered 2022-01-08: 2 g via INTRAVENOUS
  Filled 2022-01-05: qty 100

## 2022-01-05 MED ORDER — EPINEPHRINE HCL 5 MG/250ML IV SOLN IN NS
0.0000 ug/min | INTRAVENOUS | Status: DC
  Filled 2022-01-05: qty 250

## 2022-01-05 MED ORDER — POTASSIUM CHLORIDE 2 MEQ/ML IV SOLN
80.0000 meq | INTRAVENOUS | Status: DC
  Filled 2022-01-05: qty 40

## 2022-01-05 MED ORDER — TRANEXAMIC ACID (OHS) BOLUS VIA INFUSION
15.0000 mg/kg | INTRAVENOUS | Status: AC
  Administered 2022-01-08: 925.5 mg via INTRAVENOUS
  Filled 2022-01-05: qty 926

## 2022-01-05 MED ORDER — MANNITOL 20 % IV SOLN
INTRAVENOUS | Status: DC
  Filled 2022-01-05: qty 13

## 2022-01-05 MED ORDER — HEPARIN 30,000 UNITS/1000 ML (OHS) CELLSAVER SOLUTION
Status: DC
  Filled 2022-01-05: qty 1000

## 2022-01-07 NOTE — H&P (Signed)
[address]       [place] [address]             [phone]      Cardiothoracic Surgery Admission History and Physical   PCP is [facility] Referring Provider is Orbie Pyo, MD       Chief Complaint  Patient presents with   Aortic Insuffiency   Thoracic Aortic Aneurysm               HPI:   The patient is a 26 year old gentleman with a history of a heart murmur since childhood who is now followed by Dr. Diona Browner.  He had an echocardiogram in February 2022 showing moderate aortic insufficiency with what appeared to be a unicuspid aortic valve.  There is a mean gradient of 23 mmHg across the aortic valve with a peak gradient of 41 mmHg.  There was no mitral regurgitation.  Left ventricular ejection fraction is 55 to 60%.  He had a follow-up 2D echo in March 2023 which showed moderate to severe aortic insufficiency with pressure half-time of 380 ms.  There was no clear flow reversal in the aortic arch.  The mean gradient across the aortic valve was 27 mmHg consistent with moderate aortic stenosis.  Left ventricular ejection fraction was 55 to 60% with an increase in left ventricular diastolic dimension to 5.25 cm compared to the previous echo when it was 4.99 cm.  A cardiac MRI on 09/01/2021 showed severe aortic insufficiency with a regurgitant fraction of 55%.  The aortic valve appeared to be bicuspid.  Ventricular ejection fraction was 55%.  Right ventricular size and function were normal.  The aortic root at the sinuses of Valsalva measured 4.3 cm and the ascending aorta measured 4.2 cm.  He subsequent underwent cardiac catheterization on 12/18/2021 showing normal coronary anatomy.  There is no obstructive coronary disease.  Right and left heart pressures were normal with a mean gradient of 31 mmHg across the aortic valve.  A CTA of the chest on 12/11/2021 showed a dilated aortic root with a diameter of 4.8 cm and an ascending aortic aneurysm  measuring 5 cm in diameter.  The proximal aortic arch measured 3.3 cm and the distal arch 2.4 cm.  The descending thoracic aorta measured 2.3 cm.  There is no evidence of aortic dissection.   He is married and lives with his wife and child.  He works for [employer]  He is a non-smoker.  He reports occasional episodes of atypical chest discomfort.  He has had some episodes of lightheadedness but no syncope.  He does report some exertional shortness of breath but no fatigue.  He denies any peripheral edema.       Past Medical History:  Diagnosis Date   Seasonal allergies     Unicuspid aortic valve             Past Surgical History:  Procedure Laterality Date   2D echo        Echocardiogram done 08/20/19 showed an EF of > 55% withmoderate AI and a AV Mean Grad of 38 mm Hg. b.3/2023aortic stenosis with moderate to severe eccentric aortic regurgitation and EF of 55-60%   I & D EXTREMITY Left 12/06/2018    Procedure: IRRIGATION AND DEBRIDEMENT EXTREMITY;  Surgeon: Dominica Severin, MD;  Location: [facility] OR;  Service: Orthopedics;  Laterality: Left;   RIGHT/LEFT HEART CATH AND CORONARY ANGIOGRAPHY N/A 12/18/2021    Procedure: RIGHT/LEFT HEART CATH  AND CORONARY ANGIOGRAPHY;  Surgeon: Orbie Pyo, MD;  Location: [facility] INVASIVE CV LAB;  Service: Cardiovascular;  Laterality: N/A;   TONSILLECTOMY AND ADENOIDECTOMY   12/07/2010    Procedure: TONSILLECTOMY AND ADENOIDECTOMY;  Surgeon: Darletta Moll;  Location: [facility] ORS;  Service: ENT;  Laterality: Bilateral;           Family History  Problem Relation Age of Onset   Healthy Mother     Healthy Father        Social History Social History         Tobacco Use   Smoking status: Never      Passive exposure: Never   Smokeless tobacco: Never  Vaping Use   Vaping Use: Every day   Substances: Nicotine, Flavoring  Substance Use Topics   Alcohol use: Yes      Comment: occ   Drug use: No            Current Outpatient Medications  Medication Sig Dispense  Refill   cetirizine (ZYRTEC) 10 MG tablet Take 10 mg by mouth daily.       VITAMIN D PO Take 2,000 Units by mouth daily.        No current facility-administered medications for this visit.           Allergies  Allergen Reactions   Amoxicillin Hives   Penicillins Itching      Did it involve swelling of the face/tongue/throat, SOB, or low BP? No Did it involve sudden or severe rash/hives, skin peeling, or any reaction on the inside of your mouth or nose? No Did you need to seek medical attention at a hospital or doctor's office? No When did it last happen?       If all above answers are "NO", may proceed with cephalosporin use.        Review of Systems  Constitutional:  Negative for activity change, chills, fatigue and fever.  HENT:  Negative for dental problem.        Sees his dentist regularly  Eyes: Negative.   Respiratory:  Positive for shortness of breath.   Cardiovascular:  Positive for chest pain. Negative for leg swelling.  Gastrointestinal: Negative.   Endocrine: Negative.   Genitourinary: Negative.   Musculoskeletal: Negative.   Allergic/Immunologic: Negative.   Neurological:  Positive for dizziness. Negative for syncope.  Hematological: Negative.   Psychiatric/Behavioral: Negative.        BP 137/74 (BP Location: Right Arm, Patient Position: Sitting, Cuff Size: Normal)   Pulse 84   Resp 20   Ht 5\' 9"  (1.753 m)   Wt 137 lb 12.8 oz (62.5 kg)   SpO2 100% Comment: RA  BMI 20.35 kg/m  Physical Exam Constitutional:      Appearance: Normal appearance. He is normal weight.  HENT:     Head: Normocephalic and atraumatic.     Mouth/Throat:     Mouth: Mucous membranes are moist.     Pharynx: Oropharynx is clear.  Eyes:     Extraocular Movements: Extraocular movements intact.     Conjunctiva/sclera: Conjunctivae normal.     Pupils: Pupils are equal, round, and reactive to light.  Cardiovascular:     Rate and Rhythm: Normal rate and regular rhythm.     Pulses:  Normal pulses.     Heart sounds: Murmur heard.     Comments: 3/6 systolic murmur along the right sternal border.  2/6 diastolic murmur along the left lower sternal border. Pulmonary:  Effort: Pulmonary effort is normal.     Breath sounds: Normal breath sounds.  Abdominal:     General: Abdomen is flat. Bowel sounds are normal. There is no distension.     Palpations: Abdomen is soft.     Tenderness: There is no abdominal tenderness.  Musculoskeletal:        General: No swelling.     Cervical back: Normal range of motion and neck supple.  Skin:    General: Skin is warm and dry.  Neurological:     General: No focal deficit present.     Mental Status: He is alert and oriented to person, place, and time.  Psychiatric:        Mood and Affect: Mood normal.        Behavior: Behavior normal.        Thought Content: Thought content normal.        Judgment: Judgment normal.          Diagnostic Tests:   ECHOCARDIOGRAM REPORT       Patient Name:   Larry BrashDennis H Enfield Jr. Date of Exam: 05/25/2021  Medical Rec #:  [id number]            Height:       68.0 in  Accession #:    [id number]           Weight:       133.2 lb  Date of Birth:  20-Dec-1995            BSA:          1.719 m  Patient Age:    26 years             BP:           125/82 mmHg  Patient Gender: M                    HR:           95 bpm.  Exam Location:  [facility]   Procedure: 2D Echo, Cardiac Doppler, Color Doppler, 3D Echo and Strain  Analysis   Indications:   Q24.8 (ICD-10-CM) - Unicuspid aortic valve    History:        Patient has prior history of Echocardiogram examinations,  most                 recent 04/13/2020. Aortic Valve Disease; Risk  Factors:Non-Smoker.    Sonographer:   San JettyHollie Walker  Referring Phys: [id number] SAM[address] MCDOWELL   IMPRESSIONS     1. Left ventricular ejection fraction, by estimation, is 55 to 60%. The  left ventricle has normal function. The left ventricle has no regional  wall motion  abnormalities. Left ventricular diastolic parameters were  normal. The average left ventricular  global longitudinal strain is -19.3 %. The global longitudinal strain is  normal.   2. Right ventricular systolic function is normal. The right ventricular  size is normal. There is normal pulmonary artery systolic pressure. The  estimated right ventricular systolic pressure is 27.8 mmHg.   3. The mitral valve is grossly normal. Trivial mitral valve  regurgitation.   4. The aortic valve is abnormal. Described as unicuspid previously and  does appear that way in some views, in others more bicuspid. Aortic valve  regurgitation is moderate to severe, eccentric (no clear flow reversal in  aortic arch). Moderate aortic  valve stenosis. Aortic regurgitation PHT measures 380 msec. Aortic valve  area, by VTI measures  1.32 cm. Aortic valve mean gradient measures 26.8  mmHg. Dimentiionless index 0.42.   5. Aortic dilatation noted. There is borderline dilatation of the aortic  root, measuring 39 mm.   6. The inferior vena cava is normal in size with greater than 50%  respiratory variability, suggesting right atrial pressure of 3 mmHg.   Comparison(s): Prior images reviewed side by side. Overall moderate aortic  stenosis and moderate to severe, eccentric aortic regurgitation.   FINDINGS   Left Ventricle: Left ventricular ejection fraction, by estimation, is 55  to 60%. The left ventricle has normal function. The left ventricle has no  regional wall motion abnormalities. The average left ventricular global  longitudinal strain is -19.3 %.  The global longitudinal strain is normal. The left ventricular internal  cavity size was normal in size. There is borderline left ventricular  hypertrophy. Left ventricular diastolic parameters were normal.   Right Ventricle: The right ventricular size is normal. No increase in  right ventricular wall thickness. Right ventricular systolic function is  normal.  There is normal pulmonary artery systolic pressure. The tricuspid  regurgitant velocity is 2.49 m/s, and   with an assumed right atrial pressure of 3 mmHg, the estimated right  ventricular systolic pressure is 27.8 mmHg.   Left Atrium: Left atrial size was normal in size.   Right Atrium: Right atrial size was normal in size.   Pericardium: There is no evidence of pericardial effusion.   Mitral Valve: The mitral valve is grossly normal. Trivial mitral valve  regurgitation.   Tricuspid Valve: The tricuspid valve is grossly normal. Tricuspid valve  regurgitation is trivial.   Aortic Valve: The aortic valve is abnormal. Aortic valve regurgitation is  moderate to severe. Aortic regurgitation PHT measures 380 msec. Moderate  aortic stenosis is present. Aortic valve mean gradient measures 26.8 mmHg.  Aortic valve peak gradient  measures 49.1 mmHg. Aortic valve area, by VTI measures 1.32 cm.   Pulmonic Valve: The pulmonic valve was grossly normal. Pulmonic valve  regurgitation is trivial.   Aorta: Aortic dilatation noted. There is borderline dilatation of the  aortic root, measuring 39 mm.   Venous: The inferior vena cava is normal in size with greater than 50%  respiratory variability, suggesting right atrial pressure of 3 mmHg.   IAS/Shunts: No atrial level shunt detected by color flow Doppler.     LEFT VENTRICLE  PLAX 2D  LVIDd:         5.25 cm      Diastology  LVIDs:         3.56 cm      LV e' medial:    7.80 cm/s  LV PW:         1.13 cm      LV E/e' medial:  12.9  LV IVS:        0.84 cm      LV e' lateral:   14.50 cm/s  LVOT diam:     2.00 cm      LV E/e' lateral: 7.0  LV SV:         84  LV SV Index:   49           2D Longitudinal Strain  LVOT Area:     3.14 cm     2D Strain GLS Avg:     -19.3 %    LV Volumes (MOD)  LV vol d, MOD A2C: 169.0 ml  LV vol d, MOD A4C: 125.0 ml  LV vol  s, MOD A2C: 47.7 ml  LV vol s, MOD A4C: 49.8 ml  LV SV MOD A2C:     121.3 ml  LV SV MOD  A4C:     125.0 ml  LV SV MOD BP:      104.0 ml   RIGHT VENTRICLE  RV Basal diam:  2.86 cm  RV Mid diam:    2.22 cm  RV S prime:     13.50 cm/s  TAPSE (M-mode): 2.4 cm   LEFT ATRIUM             Index        RIGHT ATRIUM          Index  LA Vol (A2C):   38.6 ml 22.45 ml/m  RA Area:     9.20 cm  LA Vol (A4C):   29.6 ml 17.21 ml/m  RA Volume:   16.90 ml 9.83 ml/m  LA Biplane Vol: 33.8 ml 19.66 ml/m   AORTIC VALVE                     PULMONIC VALVE  AV Area (Vmax):    1.10 cm      PV Vmax:       1.14 m/s  AV Area (Vmean):   1.16 cm      PV Peak grad:  5.2 mmHg  AV Area (VTI):     1.32 cm  AV Vmax:           350.20 cm/s  AV Vmean:          236.800 cm/s  AV VTI:            0.632 m  AV Peak Grad:      49.1 mmHg  AV Mean Grad:      26.8 mmHg  LVOT Vmax:         122.67 cm/s  LVOT Vmean:        87.633 cm/s  LVOT VTI:          0.266 m  LVOT/AV VTI ratio: 0.42  AI PHT:            380 msec  AR Vena Contracta: 0.66 cm    AORTA  Ao Root diam: 3.90 cm  Ao Asc diam:  3.80 cm  Ao Arch diam: 2.8 cm   MITRAL VALVE                TRICUSPID VALVE  MV Area (PHT): 3.40 cm     TR Peak grad:   24.8 mmHg  MV Decel Time: 223 msec     TR Vmax:        249.00 cm/s  MV E velocity: 101.00 cm/s  MV A velocity: 104.00 cm/s  SHUNTS  MV E/A ratio:  0.97         Systemic VTI:  0.27 m                              Systemic Diam: 2.00 cm   Nona Dell MD  Electronically signed by Nona Dell MD  Signature Date/Time: 05/26/2021/12:32:07 PM         Narrative & Impression  CLINICAL DATA:  Aortic insufficiency   EXAM: CARDIAC MRI   TECHNIQUE: The patient was scanned on a 1.5 Tesla GE magnet. A dedicated cardiac coil was used. Functional imaging was done using Fiesta sequences. 2,3, and 4 chamber views were done to assess for  RWMA's. Modified Simpson's rule using a short axis stack was used to calculate an ejection fraction on a dedicated work [employer]. Flow sequences  were done. The patient received 8 cc of Gadavist. After 10 minutes inversion recovery sequences were used to assess for infiltration and scar tissue.   FINDINGS: Limited images of the lung fields showed no gross abnormalities.   The ascending aorta was dilated to 4.2 cm, the aortic root at sinuses of valsalva measured 4.3 cm. The left ventricle is mild to moderately dilated with normal wall thickness. Normal wall motion, EF 55%. Normal right ventricular size and systolic function, EF 52%. Normal left and right atrial sizes. Visually, I did not see significant mitral regurgitation though calculated mitral regurgitant fraction by LVSV and aortic forward volume would be about 20%. The aortic valve was bicuspid. There was a degree of stenosis present, this would be better-delineated by echo. There was severe aortic insufficiency with regurgitant fraction calculated 55%.   On delayed enhancement imaging, there was no myocardial late gadolinium enhancement (LGE) noted.   MEASUREMENTS: MEASUREMENTS LVEDV 228 mL   LVEDVi 133 mL/m2   LVSV 103 mL   LVEF 55%   RVEDV 167 mL RVEDVi 97 mL/m2   RVSV 86 mL   RVEF 52%   Aortic forward volume 82 mL   Aortic reverse volume 46 mL   Aortic regurgitant fraction 55%   IMPRESSION: 1.  Mild to moderate LV dilation with EF 55%, normal wall motion.   2.  Normal RV size and systolic function, EF 52%.   3.  Mildly dilated aortic root and ascending aorta.   4. Bicuspid aortic valve with severe aortic regurgitation (regurgitant fraction 55%). There was a degree of aortic stenosis also present better assessed by echo.   5. Visually there did not appear to be significant mitral regurgitation though calculated regurgitant fraction was 20%. Would confirm presence or absence by echo.   6.  No delayed enhancement.   Dalton Mclean     Electronically Signed   By: Marca Ancona M.D.   On: 09/03/2021 10:22      Physicians   Panel  Physicians Referring Physician Case Authorizing Physician  Orbie Pyo, MD (Primary)        Procedures   RIGHT/LEFT HEART CATH AND CORONARY ANGIOGRAPHY    Conclusion   1.  Normal right dominant coronary circulation. 2.  Fick cardiac output of 9.48 L/min and Fick cardiac index of 5.4 L/min/m; mean right atrial pressure of 2 mmHg, mean wedge pressure of 9 mmHg, mean PA pressure of 13 mmHg, and LVEDP of 9 mmHg. 3.  Mean gradient across the aortic valve of 31 mmHg.   Recommendation: Cardiothoracic surgery evaluation.   Indications   Aneurysm of ascending aorta without rupture (HCC) [I71.21 (ICD-10-CM)]    Procedural Details   Technical Details The patient is a 26 year old male with a history of a bicuspid aortic valve with severe aortic insufficiency and thoracic ascending aortic aneurysm who is referred for a preprocedural assessment prior to aortic valve replacement and ascending aortic aneurysm repair.  After obtaining consent the patient brought to the cardiac catheterization laboratory and prepped and draped in sterile fashion.  Xylocaine was used to anesthetize the area around t a previously placed antecubital IV in the right radial site.  The previously placed antecubital IV was exchanged for a 5 [place] Terumo glide sheath.  Right radial access was obtained and a 6 French femoral glide sheath was placed.  5000 units heparin and 5 mg verapamil were administered through the sheath.  Right heart catheterization was performed with a 5 [place] balloontipped catheter.  Coronary angiography was performed with a 5 [place] JR4 catheter and a 5 [place] JL 4 catheter.  Left heart catheterization performed with a 5 French pigtail catheter.  There were no acute complications.  A TR band was placed and manual pressure applied to the antecubital site. Estimated blood loss <50 mL.   During this procedure medications were administered to achieve and maintain moderate conscious sedation while the  patient's heart rate, blood pressure, and oxygen saturation were continuously monitored and I was present face-to-face 100% of this time.    Medications (Filter: Administrations occurring from 0932 to 1023 on 12/18/21) Heparin (Porcine) in NaCl 1000-0.9 UT/500ML-% SOLN (mL)  Total volume: 1,000 mL Date/Time Rate/Dose/Volume Action    12/18/21 0935 500 mL Given    0935 500 mL Given    fentaNYL (SUBLIMAZE) injection (mcg)  Total dose: 25 mcg Date/Time Rate/Dose/Volume Action    12/18/21 0943 25 mcg Given    midazolam (VERSED) injection (mg)  Total dose: 1 mg Date/Time Rate/Dose/Volume Action    12/18/21 0943 1 mg Given    lidocaine (PF) (XYLOCAINE) 1 % injection (mL)  Total volume: 5 mL Date/Time Rate/Dose/Volume Action    12/18/21 0952 5 mL Given    Radial Cocktail/Verapamil only (mL)  Total volume: 10 mL Date/Time Rate/Dose/Volume Action    12/18/21 0955 10 mL Given    heparin sodium (porcine) injection (Units)  Total dose: 5,000 Units Date/Time Rate/Dose/Volume Action    12/18/21 0955 5,000 Units Given    iohexol (OMNIPAQUE) 350 MG/ML injection (mL)  Total volume: 30 mL Date/Time Rate/Dose/Volume Action    12/18/21 1014 30 mL Given      Sedation Time   Sedation Time Physician-1: 29 minutes 18 seconds Contrast   Medication Name Total Dose  iohexol (OMNIPAQUE) 350 MG/ML injection 30 mL    Radiation/Fluoro   Fluoro time: 5.2 (min) DAP: 5923 (mGycm2) Cumulative Air Kerma: 86 (mGy) Complications   Complications documented before study signed (12/18/2021 10:24 AM)    No complications were associated with this study.  Documented by Ancil Linsey, RT - 12/18/2021 10:13 AM      Coronary Findings   Diagnostic Dominance: Right No diagnostic findings have been documented. Intervention   No interventions have been documented. Coronary Diagrams   Diagnostic Dominance: Right  Intervention    Implants    No implant documentation for this case.    Syngo  Images    Show images for CARDIAC CATHETERIZATION Images on Long Term Storage    Show images for Cliff, Damiani to Procedure Log   Procedure Log    Hemo Data   Flowsheet Row Most Recent Value  Fick Cardiac Output 9.48 L/min  Fick Cardiac Output Index 5.4 (L/min)/BSA  Aortic Mean Gradient 30.92 mmHg  Aortic Peak Gradient 26.2 mmHg  Aortic Valve Area 1.94  Aortic Value Area Index 1.1 cm2/BSA  RA A Wave 7 mmHg  RA V Wave 4 mmHg  RA Mean 2 mmHg  RV Systolic Pressure 23 mmHg  RV Diastolic Pressure -6 mmHg  RV EDP 4 mmHg  PA Systolic Pressure 22 mmHg  PA Diastolic Pressure 7 mmHg  PA Mean 13 mmHg  PW A Wave 12 mmHg  PW V Wave 16 mmHg  PW Mean 9 mmHg  AO Systolic Pressure 103 mmHg  AO Diastolic Pressure 55 mmHg  AO  Mean 76 mmHg  LV Systolic Pressure 130 mmHg  LV Diastolic Pressure 0 mmHg  LV EDP 13 mmHg  AOp Systolic Pressure 106 mmHg  AOp Diastolic Pressure 55 mmHg  AOp Mean Pressure 78 mmHg  LVp Systolic Pressure 127 mmHg  LVp Diastolic Pressure -8 mmHg  LVp EDP Pressure 9 mmHg  QP/QS 0.75  TPVR Index 3.21 HRUI  TSVR Index 14.08 HRUI  PVR SVR Ratio 0.07  TPVR/TSVR Ratio 0.23      Narrative & Impression  CLINICAL DATA:  History of bicuspid aortic valve with associated aortic valvular insufficiency and aneurysmal dilatation of the ascending thoracic aorta and aortic root by prior imaging.   EXAM: CT ANGIOGRAPHY CHEST WITH CONTRAST   TECHNIQUE: Multidetector CT imaging of the chest was performed using the standard protocol during bolus administration of intravenous contrast. Multiplanar CT image reconstructions and MIPs were obtained to evaluate the vascular anatomy. A gated acquisition was performed for optimal evaluation of the aortic root and ascending thoracic aorta.   RADIATION DOSE REDUCTION: This exam was performed according to the departmental dose-optimization program which includes automated exposure control, adjustment of the mA and/or  kV according to patient size and/or use of iterative reconstruction technique.   CONTRAST:  80mL OMNIPAQUE IOHEXOL 350 MG/ML SOLN   COMPARISON:  Cardiac MRI study on 09/01/2021   FINDINGS: Cardiovascular: The aortic valve is thickened, consistent with known bicuspid morphology. Dilated aortic root measures approximately 4.6-4.8 cm in estimated diameter at the level of the sinuses of Valsalva. The ascending thoracic aorta is significantly dilated and measures up to approximately 5 cm in maximum diameter. The proximal arch measures 3.3 cm and the distal arch 2.4 cm. The descending thoracic aorta measures 2.3 cm. No evidence of aortic dissection. Visualized proximal great vessels demonstrate normal patency and bovine branching anatomy.   The heart size is normal. There may be some degree of left ventricular dilatation. No pericardial fluid identified. Central pulmonary arteries are normal in caliber.   Mediastinum/Nodes: No enlarged mediastinal, hilar, or axillary lymph nodes. Thyroid gland, trachea, and esophagus demonstrate no significant findings.   Lungs/Pleura: There is no evidence of pulmonary edema, consolidation, pneumothorax, nodule or pleural fluid.   Upper Abdomen: No acute abnormality.   Musculoskeletal: No chest wall abnormality. No acute or significant osseous findings.   Review of the MIP images confirms the above findings.   IMPRESSION: 1. Thickened aortic valve, consistent with known bicuspid morphology. 2. Dilated aortic root measuring approximately 4.6-4.8 cm in estimated diameter at the level of the sinuses of Valsalva. The ascending thoracic aorta is significantly dilated and measures up to approximately 5 cm in maximum diameter. Ascending thoracic aortic aneurysm. Recommend semi-annual imaging followup by CTA or MRA and referral to cardiothoracic surgery if not already obtained. This recommendation follows  2010 ACCF/AHA/AATS/ACR/ASA/SCA/SCAI/SIR/STS/SVM Guidelines for the Diagnosis and Management of Patients With Thoracic Aortic Disease. Circulation. 2010; 121: Z610-R604. Aortic aneurysm NOS (ICD10-I71.9) 3. No evidence of aortic dissection. 4. Possible left ventricular dilatation.   Aortic aneurysm NOS (ICD10-I71.9).     Electronically Signed   By: Irish Lack M.D.   On: 12/11/2021 14:30      Result History     Impression:   This 26 year old gentleman has a bicuspid aortic valve with moderate aortic stenosis and severe aortic insufficiency with a 4.8 cm aortic root and 5 cm fusiform ascending aortic aneurysm.  He has NYHA class II symptoms of exertional shortness of breath as well as occasional episodes of dizziness and chest  discomfort.  His echocardiogram shows an increase in the left ventricular diastolic dimension.  I think it is time to proceed with replacement of his aortic valve and ascending aortic aneurysm to prevent left ventricular deterioration and aortic dissection.  I have reviewed the studies with him and his wife.  I recommended Bentall procedure using a mechanical valve conduit with replacement of the aorta to the proximal aortic arch.  This will require a period of circulatory arrest.  He has no contraindications to anticoagulation and feels that he could be compliant with taking Coumadin. I discussed the operative procedure with the patient and his wife including alternatives, benefits and risks; including but not limited to bleeding, blood transfusion, infection, stroke, myocardial infarction,heart block requiring a permanent pacemaker, organ dysfunction, and death.  Larry Brash. understands and agrees to proceed.     Plan:  Bentall procedure using a mechanical valve conduit with replacement of the aorta to the proximal aortic arch using hypothermic circulatory arrest.   [name], MD [facility] [phone]

## 2022-01-08 ENCOUNTER — Encounter (HOSPITAL_COMMUNITY): Payer: Self-pay | Admitting: Surgery

## 2022-01-08 ENCOUNTER — Inpatient Hospital Stay (HOSPITAL_COMMUNITY)
Admission: RE | Disposition: A | Discharge status: Home / Self Care | Payer: Self-pay | Source: Home / Self Care | Attending: Surgery

## 2022-01-08 ENCOUNTER — Inpatient Hospital Stay (HOSPITAL_COMMUNITY): Payer: BC Managed Care – PPO | Admitting: Vascular Surgery

## 2022-01-08 ENCOUNTER — Other Ambulatory Visit: Payer: Self-pay

## 2022-01-08 ENCOUNTER — Inpatient Hospital Stay (HOSPITAL_COMMUNITY)
Admission: RE | Admit: 2022-01-08 | Discharge: 2022-01-14 | DRG: 220 | Disposition: A | Discharge status: Home / Self Care | Payer: BC Managed Care – PPO | Attending: Surgery | Admitting: Surgery

## 2022-01-08 ENCOUNTER — Inpatient Hospital Stay (HOSPITAL_COMMUNITY): Payer: BC Managed Care – PPO

## 2022-01-08 DIAGNOSIS — E8770 Fluid overload, unspecified: Secondary | ICD-10-CM | POA: Diagnosis not present

## 2022-01-08 DIAGNOSIS — I712 Thoracic aortic aneurysm, without rupture, unspecified: Secondary | ICD-10-CM | POA: Diagnosis not present

## 2022-01-08 DIAGNOSIS — F17290 Nicotine dependence, other tobacco product, uncomplicated: Secondary | ICD-10-CM | POA: Diagnosis present

## 2022-01-08 DIAGNOSIS — Z8679 Personal history of other diseases of the circulatory system: Principal | ICD-10-CM

## 2022-01-08 DIAGNOSIS — J9811 Atelectasis: Secondary | ICD-10-CM | POA: Diagnosis not present

## 2022-01-08 DIAGNOSIS — I350 Nonrheumatic aortic (valve) stenosis: Secondary | ICD-10-CM | POA: Diagnosis not present

## 2022-01-08 DIAGNOSIS — I083 Combined rheumatic disorders of mitral, aortic and tricuspid valves: Secondary | ICD-10-CM | POA: Diagnosis not present

## 2022-01-08 DIAGNOSIS — D6959 Other secondary thrombocytopenia: Secondary | ICD-10-CM | POA: Diagnosis not present

## 2022-01-08 DIAGNOSIS — I358 Other nonrheumatic aortic valve disorders: Secondary | ICD-10-CM | POA: Diagnosis not present

## 2022-01-08 DIAGNOSIS — I7121 Aneurysm of the ascending aorta, without rupture: Principal | ICD-10-CM | POA: Diagnosis present

## 2022-01-08 DIAGNOSIS — I719 Aortic aneurysm of unspecified site, without rupture: Secondary | ICD-10-CM | POA: Diagnosis not present

## 2022-01-08 DIAGNOSIS — R110 Nausea: Secondary | ICD-10-CM | POA: Diagnosis not present

## 2022-01-08 DIAGNOSIS — Z79899 Other long term (current) drug therapy: Secondary | ICD-10-CM

## 2022-01-08 DIAGNOSIS — Z880 Allergy status to penicillin: Secondary | ICD-10-CM | POA: Diagnosis not present

## 2022-01-08 DIAGNOSIS — I352 Nonrheumatic aortic (valve) stenosis with insufficiency: Secondary | ICD-10-CM | POA: Diagnosis present

## 2022-01-08 DIAGNOSIS — D62 Acute posthemorrhagic anemia: Secondary | ICD-10-CM | POA: Diagnosis not present

## 2022-01-08 DIAGNOSIS — I447 Left bundle-branch block, unspecified: Secondary | ICD-10-CM | POA: Diagnosis not present

## 2022-01-08 DIAGNOSIS — R000 Tachycardia, unspecified: Secondary | ICD-10-CM | POA: Diagnosis not present

## 2022-01-08 DIAGNOSIS — Z4682 Encounter for fitting and adjustment of non-vascular catheter: Secondary | ICD-10-CM | POA: Diagnosis not present

## 2022-01-08 DIAGNOSIS — I351 Nonrheumatic aortic (valve) insufficiency: Secondary | ICD-10-CM

## 2022-01-08 DIAGNOSIS — I7122 Aneurysm of the aortic arch, without rupture: Secondary | ICD-10-CM | POA: Diagnosis not present

## 2022-01-08 HISTORY — PX: BENTALL PROCEDURE: SHX5058

## 2022-01-08 HISTORY — PX: TEE WITHOUT CARDIOVERSION: SHX5443

## 2022-01-08 LAB — POCT I-STAT 7, (LYTES, BLD GAS, ICA,H+H)
Acid-Base Excess: 1 mmol/L (ref 0.0–2.0)
Acid-Base Excess: 1 mmol/L (ref 0.0–2.0)
Acid-Base Excess: 1 mmol/L (ref 0.0–2.0)
Acid-Base Excess: 0 mmol/L (ref 0.0–2.0)
Acid-base deficit: 1 mmol/L (ref 0.0–2.0)
Acid-base deficit: 2 mmol/L (ref 0.0–2.0)
Acid-base deficit: 1 mmol/L (ref 0.0–2.0)
Acid-base deficit: 4 mmol/L — ABNORMAL HIGH (ref 0.0–2.0)
Acid-base deficit: 3 mmol/L — ABNORMAL HIGH (ref 0.0–2.0)
Acid-base deficit: 3 mmol/L — ABNORMAL HIGH (ref 0.0–2.0)
Bicarbonate: 26.5 mmol/L (ref 20.0–28.0)
Bicarbonate: 25.7 mmol/L (ref 20.0–28.0)
Bicarbonate: 22.8 mmol/L (ref 20.0–28.0)
Bicarbonate: 25.6 mmol/L (ref 20.0–28.0)
Bicarbonate: 25.7 mmol/L (ref 20.0–28.0)
Bicarbonate: 23.5 mmol/L (ref 20.0–28.0)
Bicarbonate: 24.1 mmol/L (ref 20.0–28.0)
Bicarbonate: 21.8 mmol/L (ref 20.0–28.0)
Bicarbonate: 23.5 mmol/L (ref 20.0–28.0)
Bicarbonate: 22.3 mmol/L (ref 20.0–28.0)
Calcium, Ion: 1.27 mmol/L (ref 1.15–1.40)
Calcium, Ion: 1.01 mmol/L — ABNORMAL LOW (ref 1.15–1.40)
Calcium, Ion: 1.02 mmol/L — ABNORMAL LOW (ref 1.15–1.40)
Calcium, Ion: 1.07 mmol/L — ABNORMAL LOW (ref 1.15–1.40)
Calcium, Ion: 1 mmol/L — ABNORMAL LOW (ref 1.15–1.40)
Calcium, Ion: 0.98 mmol/L — ABNORMAL LOW (ref 1.15–1.40)
Calcium, Ion: 1 mmol/L — ABNORMAL LOW (ref 1.15–1.40)
Calcium, Ion: 1.03 mmol/L — ABNORMAL LOW (ref 1.15–1.40)
Calcium, Ion: 1.14 mmol/L — ABNORMAL LOW (ref 1.15–1.40)
Calcium, Ion: 1.19 mmol/L (ref 1.15–1.40)
HCT: 38 % — ABNORMAL LOW (ref 39.0–52.0)
HCT: 26 % — ABNORMAL LOW (ref 39.0–52.0)
HCT: 24 % — ABNORMAL LOW (ref 39.0–52.0)
HCT: 30 % — ABNORMAL LOW (ref 39.0–52.0)
HCT: 29 % — ABNORMAL LOW (ref 39.0–52.0)
HCT: 25 % — ABNORMAL LOW (ref 39.0–52.0)
HCT: 26 % — ABNORMAL LOW (ref 39.0–52.0)
HCT: 32 % — ABNORMAL LOW (ref 39.0–52.0)
HCT: 29 % — ABNORMAL LOW (ref 39.0–52.0)
HCT: 28 % — ABNORMAL LOW (ref 39.0–52.0)
Hemoglobin: 12.9 g/dL — ABNORMAL LOW (ref 13.0–17.0)
Hemoglobin: 8.8 g/dL — ABNORMAL LOW (ref 13.0–17.0)
Hemoglobin: 10.2 g/dL — ABNORMAL LOW (ref 13.0–17.0)
Hemoglobin: 8.2 g/dL — ABNORMAL LOW (ref 13.0–17.0)
Hemoglobin: 9.9 g/dL — ABNORMAL LOW (ref 13.0–17.0)
Hemoglobin: 8.5 g/dL — ABNORMAL LOW (ref 13.0–17.0)
Hemoglobin: 8.8 g/dL — ABNORMAL LOW (ref 13.0–17.0)
Hemoglobin: 10.9 g/dL — ABNORMAL LOW (ref 13.0–17.0)
Hemoglobin: 9.9 g/dL — ABNORMAL LOW (ref 13.0–17.0)
Hemoglobin: 9.5 g/dL — ABNORMAL LOW (ref 13.0–17.0)
O2 Saturation: 100 %
O2 Saturation: 100 %
O2 Saturation: 100 %
O2 Saturation: 100 %
O2 Saturation: 100 %
O2 Saturation: 100 %
O2 Saturation: 100 %
O2 Saturation: 100 %
O2 Saturation: 100 %
O2 Saturation: 100 %
Patient temperature: 36
Patient temperature: 37
Patient temperature: 37.2
Potassium: 3.9 mmol/L (ref 3.5–5.1)
Potassium: 4.3 mmol/L (ref 3.5–5.1)
Potassium: 3.3 mmol/L — ABNORMAL LOW (ref 3.5–5.1)
Potassium: 4.7 mmol/L (ref 3.5–5.1)
Potassium: 3.9 mmol/L (ref 3.5–5.1)
Potassium: 3.9 mmol/L (ref 3.5–5.1)
Potassium: 4.3 mmol/L (ref 3.5–5.1)
Potassium: 3.6 mmol/L (ref 3.5–5.1)
Potassium: 4.5 mmol/L (ref 3.5–5.1)
Potassium: 4.4 mmol/L (ref 3.5–5.1)
Sodium: 139 mmol/L (ref 135–145)
Sodium: 138 mmol/L (ref 135–145)
Sodium: 137 mmol/L (ref 135–145)
Sodium: 137 mmol/L (ref 135–145)
Sodium: 138 mmol/L (ref 135–145)
Sodium: 137 mmol/L (ref 135–145)
Sodium: 137 mmol/L (ref 135–145)
Sodium: 141 mmol/L (ref 135–145)
Sodium: 140 mmol/L (ref 135–145)
Sodium: 140 mmol/L (ref 135–145)
TCO2: 28 mmol/L (ref 22–32)
TCO2: 27 mmol/L (ref 22–32)
TCO2: 24 mmol/L (ref 22–32)
TCO2: 27 mmol/L (ref 22–32)
TCO2: 27 mmol/L (ref 22–32)
TCO2: 25 mmol/L (ref 22–32)
TCO2: 25 mmol/L (ref 22–32)
TCO2: 23 mmol/L (ref 22–32)
TCO2: 25 mmol/L (ref 22–32)
TCO2: 24 mmol/L (ref 22–32)
pCO2 arterial: 45.7 mmHg (ref 32–48)
pCO2 arterial: 42.5 mmHg (ref 32–48)
pCO2 arterial: 35.1 mmHg (ref 32–48)
pCO2 arterial: 57.3 mmHg — ABNORMAL HIGH (ref 32–48)
pCO2 arterial: 39.1 mmHg (ref 32–48)
pCO2 arterial: 35.7 mmHg (ref 32–48)
pCO2 arterial: 37.4 mmHg (ref 32–48)
pCO2 arterial: 39.1 mmHg (ref 32–48)
pCO2 arterial: 45.6 mmHg (ref 32–48)
pCO2 arterial: 42.7 mmHg (ref 32–48)
pH, Arterial: 7.371 (ref 7.35–7.45)
pH, Arterial: 7.389 (ref 7.35–7.45)
pH, Arterial: 7.259 — ABNORMAL LOW (ref 7.35–7.45)
pH, Arterial: 7.42 (ref 7.35–7.45)
pH, Arterial: 7.426 (ref 7.35–7.45)
pH, Arterial: 7.406 (ref 7.35–7.45)
pH, Arterial: 7.439 (ref 7.35–7.45)
pH, Arterial: 7.35 (ref 7.35–7.45)
pH, Arterial: 7.319 — ABNORMAL LOW (ref 7.35–7.45)
pH, Arterial: 7.327 — ABNORMAL LOW (ref 7.35–7.45)
pO2, Arterial: 519 mmHg — ABNORMAL HIGH (ref 83–108)
pO2, Arterial: 643 mmHg — ABNORMAL HIGH (ref 83–108)
pO2, Arterial: 515 mmHg — ABNORMAL HIGH (ref 83–108)
pO2, Arterial: 532 mmHg — ABNORMAL HIGH (ref 83–108)
pO2, Arterial: 498 mmHg — ABNORMAL HIGH (ref 83–108)
pO2, Arterial: 301 mmHg — ABNORMAL HIGH (ref 83–108)
pO2, Arterial: 448 mmHg — ABNORMAL HIGH (ref 83–108)
pO2, Arterial: 284 mmHg — ABNORMAL HIGH (ref 83–108)
pO2, Arterial: 207 mmHg — ABNORMAL HIGH (ref 83–108)
pO2, Arterial: 218 mmHg — ABNORMAL HIGH (ref 83–108)

## 2022-01-08 LAB — BASIC METABOLIC PANEL
Anion gap: 5 (ref 5–15)
BUN: 14 mg/dL (ref 6–20)
CO2: 23 mmol/L (ref 22–32)
Calcium: 8.1 mg/dL — ABNORMAL LOW (ref 8.9–10.3)
Chloride: 111 mmol/L (ref 98–111)
Creatinine, Ser: 1.17 mg/dL (ref 0.61–1.24)
GFR, Estimated: >60 mL/min (ref >60)
Glucose, Bld: 129 mg/dL — ABNORMAL HIGH (ref 70–99)
Potassium: 4.6 mmol/L (ref 3.5–5.1)
Sodium: 139 mmol/L (ref 135–145)

## 2022-01-08 LAB — POCT I-STAT, CHEM 8
BUN: 20 mg/dL (ref 6–20)
BUN: 16 mg/dL (ref 6–20)
BUN: 18 mg/dL (ref 6–20)
BUN: 17 mg/dL (ref 6–20)
BUN: 16 mg/dL (ref 6–20)
BUN: 17 mg/dL (ref 6–20)
BUN: 15 mg/dL (ref 6–20)
Calcium, Ion: 1.29 mmol/L (ref 1.15–1.40)
Calcium, Ion: 0.92 mmol/L — ABNORMAL LOW (ref 1.15–1.40)
Calcium, Ion: 1.27 mmol/L (ref 1.15–1.40)
Calcium, Ion: 1.06 mmol/L — ABNORMAL LOW (ref 1.15–1.40)
Calcium, Ion: 0.96 mmol/L — ABNORMAL LOW (ref 1.15–1.40)
Calcium, Ion: 0.97 mmol/L — ABNORMAL LOW (ref 1.15–1.40)
Calcium, Ion: 1.01 mmol/L — ABNORMAL LOW (ref 1.15–1.40)
Chloride: 104 mmol/L (ref 98–111)
Chloride: 101 mmol/L (ref 98–111)
Chloride: 104 mmol/L (ref 98–111)
Chloride: 99 mmol/L (ref 98–111)
Chloride: 101 mmol/L (ref 98–111)
Chloride: 101 mmol/L (ref 98–111)
Chloride: 100 mmol/L (ref 98–111)
Creatinine, Ser: 0.8 mg/dL (ref 0.61–1.24)
Creatinine, Ser: 0.6 mg/dL — ABNORMAL LOW (ref 0.61–1.24)
Creatinine, Ser: 0.8 mg/dL (ref 0.61–1.24)
Creatinine, Ser: 0.8 mg/dL (ref 0.61–1.24)
Creatinine, Ser: 0.6 mg/dL — ABNORMAL LOW (ref 0.61–1.24)
Creatinine, Ser: 0.6 mg/dL — ABNORMAL LOW (ref 0.61–1.24)
Creatinine, Ser: 0.7 mg/dL (ref 0.61–1.24)
Glucose, Bld: 98 mg/dL (ref 70–99)
Glucose, Bld: 101 mg/dL — ABNORMAL HIGH (ref 70–99)
Glucose, Bld: 109 mg/dL — ABNORMAL HIGH (ref 70–99)
Glucose, Bld: 188 mg/dL — ABNORMAL HIGH (ref 70–99)
Glucose, Bld: 178 mg/dL — ABNORMAL HIGH (ref 70–99)
Glucose, Bld: 201 mg/dL — ABNORMAL HIGH (ref 70–99)
Glucose, Bld: 182 mg/dL — ABNORMAL HIGH (ref 70–99)
HCT: 38 % — ABNORMAL LOW (ref 39.0–52.0)
HCT: 27 % — ABNORMAL LOW (ref 39.0–52.0)
HCT: 37 % — ABNORMAL LOW (ref 39.0–52.0)
HCT: 26 % — ABNORMAL LOW (ref 39.0–52.0)
HCT: 26 % — ABNORMAL LOW (ref 39.0–52.0)
HCT: 28 % — ABNORMAL LOW (ref 39.0–52.0)
HCT: 26 % — ABNORMAL LOW (ref 39.0–52.0)
Hemoglobin: 12.9 g/dL — ABNORMAL LOW (ref 13.0–17.0)
Hemoglobin: 12.6 g/dL — ABNORMAL LOW (ref 13.0–17.0)
Hemoglobin: 9.2 g/dL — ABNORMAL LOW (ref 13.0–17.0)
Hemoglobin: 8.8 g/dL — ABNORMAL LOW (ref 13.0–17.0)
Hemoglobin: 8.8 g/dL — ABNORMAL LOW (ref 13.0–17.0)
Hemoglobin: 9.5 g/dL — ABNORMAL LOW (ref 13.0–17.0)
Hemoglobin: 8.8 g/dL — ABNORMAL LOW (ref 13.0–17.0)
Potassium: 3.9 mmol/L (ref 3.5–5.1)
Potassium: 3.8 mmol/L (ref 3.5–5.1)
Potassium: 4.4 mmol/L (ref 3.5–5.1)
Potassium: 4.7 mmol/L (ref 3.5–5.1)
Potassium: 3.6 mmol/L (ref 3.5–5.1)
Potassium: 4 mmol/L (ref 3.5–5.1)
Potassium: 3.9 mmol/L (ref 3.5–5.1)
Sodium: 139 mmol/L (ref 135–145)
Sodium: 139 mmol/L (ref 135–145)
Sodium: 139 mmol/L (ref 135–145)
Sodium: 137 mmol/L (ref 135–145)
Sodium: 136 mmol/L (ref 135–145)
Sodium: 139 mmol/L (ref 135–145)
Sodium: 137 mmol/L (ref 135–145)
TCO2: 29 mmol/L (ref 22–32)
TCO2: 25 mmol/L (ref 22–32)
TCO2: 26 mmol/L (ref 22–32)
TCO2: 25 mmol/L (ref 22–32)
TCO2: 24 mmol/L (ref 22–32)
TCO2: 25 mmol/L (ref 22–32)
TCO2: 21 mmol/L — ABNORMAL LOW (ref 22–32)

## 2022-01-08 LAB — HEMOGLOBIN AND HEMATOCRIT, BLOOD
HCT: 27.2 % — ABNORMAL LOW (ref 39.0–52.0)
Hemoglobin: 9.9 g/dL — ABNORMAL LOW (ref 13.0–17.0)

## 2022-01-08 LAB — CBC
HCT: 34.6 % — ABNORMAL LOW (ref 39.0–52.0)
HCT: 30.8 % — ABNORMAL LOW (ref 39.0–52.0)
Hemoglobin: 11.8 g/dL — ABNORMAL LOW (ref 13.0–17.0)
Hemoglobin: 11 g/dL — ABNORMAL LOW (ref 13.0–17.0)
MCH: 30.5 pg (ref 26.0–34.0)
MCH: 31.5 pg (ref 26.0–34.0)
MCHC: 34.1 g/dL (ref 30.0–36.0)
MCHC: 35.7 g/dL (ref 30.0–36.0)
MCV: 89.4 fL (ref 80.0–100.0)
MCV: 88.3 fL (ref 80.0–100.0)
Platelets: 95 10*3/uL — ABNORMAL LOW (ref 150–400)
Platelets: 97 10*3/uL — ABNORMAL LOW (ref 150–400)
RBC: 3.87 MIL/uL — ABNORMAL LOW (ref 4.22–5.81)
RBC: 3.49 MIL/uL — ABNORMAL LOW (ref 4.22–5.81)
RDW: 12 % (ref 11.5–15.5)
RDW: 12.3 % (ref 11.5–15.5)
WBC: 17.4 10*3/uL — ABNORMAL HIGH (ref 4.0–10.5)
WBC: 16.6 10*3/uL — ABNORMAL HIGH (ref 4.0–10.5)
nRBC: 0 % (ref 0.0–0.2)
nRBC: 0 % (ref 0.0–0.2)

## 2022-01-08 LAB — ECHO INTRAOPERATIVE TEE
AV Mean grad: 11.7 mmHg
AV Peak grad: 19.8 mmHg
Ao pk vel: 2.22 m/s
Height: 68 in
P 1/2 time: 502 msec
Weight: 2192 oz

## 2022-01-08 LAB — APTT: aPTT: 38 seconds — ABNORMAL HIGH (ref 24–36)

## 2022-01-08 LAB — GLUCOSE, CAPILLARY
Glucose-Capillary: 106 mg/dL — ABNORMAL HIGH (ref 70–99)
Glucose-Capillary: 152 mg/dL — ABNORMAL HIGH (ref 70–99)
Glucose-Capillary: 142 mg/dL — ABNORMAL HIGH (ref 70–99)
Glucose-Capillary: 114 mg/dL — ABNORMAL HIGH (ref 70–99)
Glucose-Capillary: 122 mg/dL — ABNORMAL HIGH (ref 70–99)
Glucose-Capillary: 120 mg/dL — ABNORMAL HIGH (ref 70–99)
Glucose-Capillary: 135 mg/dL — ABNORMAL HIGH (ref 70–99)

## 2022-01-08 LAB — PROTIME-INR
INR: 1.8 — ABNORMAL HIGH (ref 0.8–1.2)
Prothrombin Time: 20.7 seconds — ABNORMAL HIGH (ref 11.4–15.2)

## 2022-01-08 LAB — ABO/RH: ABO/RH(D): A NEG

## 2022-01-08 LAB — FIBRINOGEN: Fibrinogen: 119 mg/dL — ABNORMAL LOW (ref 210–475)

## 2022-01-08 LAB — PLATELET COUNT: Platelets: 116 10*3/uL — ABNORMAL LOW (ref 150–400)

## 2022-01-08 LAB — MAGNESIUM: Magnesium: 3.1 mg/dL — ABNORMAL HIGH (ref 1.7–2.4)

## 2022-01-08 SURGERY — BENTALL PROCEDURE
Anesthesia: General | Site: Chest

## 2022-01-08 MED ORDER — PROPOFOL 10 MG/ML IV BOLUS
INTRAVENOUS | Status: AC
  Filled 2022-01-08: qty 20

## 2022-01-08 MED ORDER — PHENYLEPHRINE 80 MCG/ML (10ML) SYRINGE FOR IV PUSH (FOR BLOOD PRESSURE SUPPORT)
PREFILLED_SYRINGE | INTRAVENOUS | Status: DC | PRN
  Administered 2022-01-08: 80 ug via INTRAVENOUS
  Administered 2022-01-08 (×3): 40 ug via INTRAVENOUS

## 2022-01-08 MED ORDER — EPHEDRINE 5 MG/ML INJ
INTRAVENOUS | Status: AC
  Filled 2022-01-08: qty 5

## 2022-01-08 MED ORDER — ROCURONIUM BROMIDE 10 MG/ML (PF) SYRINGE
PREFILLED_SYRINGE | INTRAVENOUS | Status: DC | PRN
  Administered 2022-01-08 (×2): 50 mg via INTRAVENOUS
  Administered 2022-01-08: 30 mg via INTRAVENOUS
  Administered 2022-01-08: 100 mg via INTRAVENOUS

## 2022-01-08 MED ORDER — ONDANSETRON HCL 4 MG/2ML IJ SOLN
4.0000 mg | Freq: Four times a day (QID) | INTRAMUSCULAR | Status: DC | PRN
  Administered 2022-01-08: 4 mg via INTRAVENOUS
  Filled 2022-01-08: qty 2

## 2022-01-08 MED ORDER — LIDOCAINE 2% (20 MG/ML) 5 ML SYRINGE
INTRAMUSCULAR | Status: AC
  Filled 2022-01-08: qty 5

## 2022-01-08 MED ORDER — METHYLPREDNISOLONE SODIUM SUCC 125 MG IJ SOLR
INTRAMUSCULAR | Status: AC
  Filled 2022-01-08: qty 2

## 2022-01-08 MED ORDER — FENTANYL CITRATE (PF) 250 MCG/5ML IJ SOLN
INTRAMUSCULAR | Status: AC
  Filled 2022-01-08: qty 5

## 2022-01-08 MED ORDER — ARTIFICIAL TEARS OPHTHALMIC OINT
TOPICAL_OINTMENT | OPHTHALMIC | Status: DC | PRN
  Administered 2022-01-08: 1 via OPHTHALMIC

## 2022-01-08 MED ORDER — BISACODYL 5 MG PO TBEC
10.0000 mg | DELAYED_RELEASE_TABLET | Freq: Every day | ORAL | Status: DC
  Administered 2022-01-09 – 2022-01-10 (×2): 10 mg via ORAL
  Filled 2022-01-08 (×2): qty 2

## 2022-01-08 MED ORDER — CHLORHEXIDINE GLUCONATE CLOTH 2 % EX PADS
6.0000 | MEDICATED_PAD | Freq: Every day | CUTANEOUS | Status: DC
  Administered 2022-01-08 – 2022-01-10 (×3): 6 via TOPICAL

## 2022-01-08 MED ORDER — ONDANSETRON HCL 4 MG/2ML IJ SOLN
INTRAMUSCULAR | Status: AC
  Filled 2022-01-08: qty 2

## 2022-01-08 MED ORDER — CHLORHEXIDINE GLUCONATE 0.12 % MT SOLN
15.0000 mL | OROMUCOSAL | Status: AC
  Administered 2022-01-08: 15 mL via OROMUCOSAL

## 2022-01-08 MED ORDER — VECURONIUM BROMIDE 10 MG IV SOLR
INTRAVENOUS | Status: AC
  Filled 2022-01-08: qty 10

## 2022-01-08 MED ORDER — METOPROLOL TARTRATE 5 MG/5ML IV SOLN: 2.5000 mg | INTRAVENOUS | Status: DC | PRN

## 2022-01-08 MED ORDER — SODIUM CHLORIDE 0.9 % IV SOLN: INTRAVENOUS | Status: DC | PRN

## 2022-01-08 MED ORDER — BISACODYL 10 MG RE SUPP: 10.0000 mg | Freq: Every day | RECTAL | Status: DC

## 2022-01-08 MED ORDER — FAMOTIDINE IN NACL 20-0.9 MG/50ML-% IV SOLN
20.0000 mg | Freq: Two times a day (BID) | INTRAVENOUS | Status: AC
  Administered 2022-01-08 (×2): 20 mg via INTRAVENOUS
  Filled 2022-01-08 (×2): qty 50

## 2022-01-08 MED ORDER — SODIUM CHLORIDE 0.9 % IV SOLN: INTRAVENOUS | Status: DC

## 2022-01-08 MED ORDER — PLASMA-LYTE A IV SOLN
INTRAVENOUS | Status: DC | PRN
  Administered 2022-01-08: 500 mL

## 2022-01-08 MED ORDER — ORAL CARE MOUTH RINSE: 15.0000 mL | OROMUCOSAL | Status: DC | PRN

## 2022-01-08 MED ORDER — LACTATED RINGERS IV SOLN: INTRAVENOUS | Status: DC | PRN

## 2022-01-08 MED ORDER — LACTATED RINGERS IV SOLN: INTRAVENOUS | Status: DC

## 2022-01-08 MED ORDER — METHYLPREDNISOLONE SODIUM SUCC 125 MG IJ SOLR
INTRAMUSCULAR | Status: DC | PRN
  Administered 2022-01-08: 125 mg via INTRAVENOUS

## 2022-01-08 MED ORDER — HEPARIN SODIUM (PORCINE) 1000 UNIT/ML IJ SOLN
INTRAMUSCULAR | Status: DC | PRN
  Administered 2022-01-08: 22000 [IU] via INTRAVENOUS

## 2022-01-08 MED ORDER — PHENYLEPHRINE 80 MCG/ML (10ML) SYRINGE FOR IV PUSH (FOR BLOOD PRESSURE SUPPORT)
PREFILLED_SYRINGE | INTRAVENOUS | Status: AC
  Filled 2022-01-08: qty 10

## 2022-01-08 MED ORDER — ROCURONIUM BROMIDE 10 MG/ML (PF) SYRINGE
PREFILLED_SYRINGE | INTRAVENOUS | Status: AC
  Filled 2022-01-08: qty 10

## 2022-01-08 MED ORDER — SODIUM CHLORIDE 0.9% IV SOLUTION: Freq: Once | INTRAVENOUS | Status: AC

## 2022-01-08 MED ORDER — ACETAMINOPHEN 160 MG/5ML PO SOLN: 650.0000 mg | Freq: Once | ORAL | Status: AC

## 2022-01-08 MED ORDER — METOPROLOL TARTRATE 25 MG/10 ML ORAL SUSPENSION
12.5000 mg | Freq: Two times a day (BID) | ORAL | Status: DC
  Filled 2022-01-08: qty 5

## 2022-01-08 MED ORDER — ACETAMINOPHEN 500 MG PO TABS
1000.0000 mg | ORAL_TABLET | Freq: Four times a day (QID) | ORAL | Status: AC
  Administered 2022-01-09 – 2022-01-13 (×19): 1000 mg via ORAL
  Filled 2022-01-08 (×20): qty 2

## 2022-01-08 MED ORDER — ASPIRIN 81 MG PO CHEW: 324.0000 mg | CHEWABLE_TABLET | Freq: Every day | ORAL | Status: DC

## 2022-01-08 MED ORDER — ACETAMINOPHEN 160 MG/5ML PO SOLN: 1000.0000 mg | Freq: Four times a day (QID) | ORAL | Status: AC

## 2022-01-08 MED ORDER — MIDAZOLAM HCL (PF) 10 MG/2ML IJ SOLN
INTRAMUSCULAR | Status: AC
  Filled 2022-01-08: qty 2

## 2022-01-08 MED ORDER — ASPIRIN 325 MG PO TBEC: 325.0000 mg | DELAYED_RELEASE_TABLET | Freq: Every day | ORAL | Status: DC

## 2022-01-08 MED ORDER — PROPOFOL 10 MG/ML IV BOLUS
INTRAVENOUS | Status: DC | PRN
  Administered 2022-01-08: 30 mg via INTRAVENOUS
  Administered 2022-01-08: 20 mg via INTRAVENOUS
  Administered 2022-01-08 (×2): 30 mg via INTRAVENOUS
  Administered 2022-01-08 (×2): 20 mg via INTRAVENOUS
  Administered 2022-01-08: 70 mg via INTRAVENOUS

## 2022-01-08 MED ORDER — SODIUM CHLORIDE 0.9% FLUSH
3.0000 mL | Freq: Two times a day (BID) | INTRAVENOUS | Status: DC
  Administered 2022-01-09 – 2022-01-10 (×2): 3 mL via INTRAVENOUS

## 2022-01-08 MED ORDER — LACTATED RINGERS IV SOLN: 500.0000 mL | Freq: Once | INTRAVENOUS | Status: DC | PRN

## 2022-01-08 MED ORDER — TRAMADOL HCL 50 MG PO TABS
50.0000 mg | ORAL_TABLET | ORAL | Status: DC | PRN
  Administered 2022-01-08: 100 mg via ORAL
  Filled 2022-01-08: qty 2

## 2022-01-08 MED ORDER — MAGNESIUM SULFATE 4 GM/100ML IV SOLN
4.0000 g | Freq: Once | INTRAVENOUS | Status: AC
  Administered 2022-01-08: 4 g via INTRAVENOUS
  Filled 2022-01-08: qty 100

## 2022-01-08 MED ORDER — PROTAMINE SULFATE 10 MG/ML IV SOLN
INTRAVENOUS | Status: DC | PRN
  Administered 2022-01-08: 180 mg via INTRAVENOUS
  Administered 2022-01-08: 20 mg via INTRAVENOUS

## 2022-01-08 MED ORDER — ALBUMIN HUMAN 5 % IV SOLN: INTRAVENOUS | Status: DC | PRN

## 2022-01-08 MED ORDER — ONDANSETRON HCL 4 MG/2ML IJ SOLN
INTRAMUSCULAR | Status: DC | PRN
  Administered 2022-01-08: 4 mg via INTRAVENOUS

## 2022-01-08 MED ORDER — DEXAMETHASONE SODIUM PHOSPHATE 10 MG/ML IJ SOLN
INTRAMUSCULAR | Status: AC
  Filled 2022-01-08: qty 1

## 2022-01-08 MED ORDER — MIDAZOLAM HCL (PF) 5 MG/ML IJ SOLN
INTRAMUSCULAR | Status: DC | PRN
  Administered 2022-01-08: 5 mg via INTRAVENOUS
  Administered 2022-01-08: 2 mg via INTRAVENOUS
  Administered 2022-01-08 (×3): 1 mg via INTRAVENOUS

## 2022-01-08 MED ORDER — DOCUSATE SODIUM 100 MG PO CAPS
200.0000 mg | ORAL_CAPSULE | Freq: Every day | ORAL | Status: DC
  Administered 2022-01-09 – 2022-01-10 (×2): 200 mg via ORAL
  Filled 2022-01-08 (×2): qty 2

## 2022-01-08 MED ORDER — SODIUM CHLORIDE 0.9 % IV SOLN: 250.0000 mL | INTRAVENOUS | Status: DC

## 2022-01-08 MED ORDER — PHENYLEPHRINE HCL-NACL 20-0.9 MG/250ML-% IV SOLN
0.0000 ug/min | INTRAVENOUS | Status: DC
  Administered 2022-01-08: 25 ug/min via INTRAVENOUS

## 2022-01-08 MED ORDER — KETOROLAC TROMETHAMINE 15 MG/ML IJ SOLN
15.0000 mg | Freq: Four times a day (QID) | INTRAMUSCULAR | Status: DC | PRN
  Administered 2022-01-08 – 2022-01-09 (×3): 15 mg via INTRAVENOUS
  Filled 2022-01-08 (×3): qty 1

## 2022-01-08 MED ORDER — SODIUM CHLORIDE 0.9% FLUSH: 3.0000 mL | INTRAVENOUS | Status: DC | PRN

## 2022-01-08 MED ORDER — DEXTROSE 50 % IV SOLN: 0.0000 mL | INTRAVENOUS | Status: DC | PRN

## 2022-01-08 MED ORDER — 0.9 % SODIUM CHLORIDE (POUR BTL) OPTIME
TOPICAL | Status: DC | PRN
  Administered 2022-01-08: 4000 mL

## 2022-01-08 MED ORDER — MORPHINE SULFATE (PF) 2 MG/ML IV SOLN
1.0000 mg | INTRAVENOUS | Status: DC | PRN
  Administered 2022-01-08 – 2022-01-10 (×9): 2 mg via INTRAVENOUS
  Filled 2022-01-08 (×9): qty 1

## 2022-01-08 MED ORDER — NITROGLYCERIN IN D5W 200-5 MCG/ML-% IV SOLN: 0.0000 ug/min | INTRAVENOUS | Status: DC

## 2022-01-08 MED ORDER — ACETAMINOPHEN 650 MG RE SUPP
650.0000 mg | Freq: Once | RECTAL | Status: AC
  Administered 2022-01-08: 650 mg via RECTAL

## 2022-01-08 MED ORDER — THROMBIN (RECOMBINANT) 20000 UNITS EX SOLR
CUTANEOUS | Status: AC
  Filled 2022-01-08: qty 20000

## 2022-01-08 MED ORDER — METOPROLOL TARTRATE 12.5 MG HALF TABLET
12.5000 mg | ORAL_TABLET | Freq: Two times a day (BID) | ORAL | Status: DC
  Administered 2022-01-10: 12.5 mg via ORAL
  Filled 2022-01-08 (×2): qty 1

## 2022-01-08 MED ORDER — METOPROLOL TARTRATE 12.5 MG HALF TABLET
12.5000 mg | ORAL_TABLET | Freq: Once | ORAL | Status: AC
  Administered 2022-01-08: 12.5 mg via ORAL
  Filled 2022-01-08: qty 1

## 2022-01-08 MED ORDER — DEXMEDETOMIDINE HCL IN NACL 400 MCG/100ML IV SOLN: 0.0000 ug/kg/h | INTRAVENOUS | Status: DC

## 2022-01-08 MED ORDER — THROMBIN (RECOMBINANT) 20000 UNITS EX SOLR
CUTANEOUS | Status: DC | PRN
  Administered 2022-01-08: 20000 [IU] via TOPICAL

## 2022-01-08 MED ORDER — PANTOPRAZOLE SODIUM 40 MG PO TBEC
40.0000 mg | DELAYED_RELEASE_TABLET | Freq: Every day | ORAL | Status: DC
  Administered 2022-01-10 – 2022-01-14 (×5): 40 mg via ORAL
  Filled 2022-01-08 (×5): qty 1

## 2022-01-08 MED ORDER — VANCOMYCIN HCL IN DEXTROSE 1-5 GM/200ML-% IV SOLN
1000.0000 mg | Freq: Once | INTRAVENOUS | Status: AC
  Administered 2022-01-08: 1000 mg via INTRAVENOUS
  Filled 2022-01-08: qty 200

## 2022-01-08 MED ORDER — POTASSIUM CHLORIDE 10 MEQ/50ML IV SOLN
10.0000 meq | INTRAVENOUS | Status: AC
  Administered 2022-01-08 (×3): 10 meq via INTRAVENOUS

## 2022-01-08 MED ORDER — INSULIN REGULAR(HUMAN) IN NACL 100-0.9 UT/100ML-% IV SOLN: INTRAVENOUS | Status: DC

## 2022-01-08 MED ORDER — FENTANYL CITRATE (PF) 250 MCG/5ML IJ SOLN
INTRAMUSCULAR | Status: DC | PRN
  Administered 2022-01-08 (×5): 50 ug via INTRAVENOUS
  Administered 2022-01-08: 200 ug via INTRAVENOUS
  Administered 2022-01-08: 50 ug via INTRAVENOUS
  Administered 2022-01-08: 200 ug via INTRAVENOUS
  Administered 2022-01-08: 50 ug via INTRAVENOUS

## 2022-01-08 MED ORDER — PROTAMINE SULFATE 10 MG/ML IV SOLN
INTRAVENOUS | Status: AC
  Filled 2022-01-08: qty 25

## 2022-01-08 MED ORDER — CHLORHEXIDINE GLUCONATE 4 % EX LIQD: 30.0000 mL | CUTANEOUS | Status: DC

## 2022-01-08 MED ORDER — SODIUM CHLORIDE 0.45 % IV SOLN: INTRAVENOUS | Status: DC | PRN

## 2022-01-08 MED ORDER — ~~LOC~~ CARDIAC SURGERY, PATIENT & FAMILY EDUCATION
Freq: Once | Status: DC
  Filled 2022-01-08: qty 1

## 2022-01-08 MED ORDER — OXYCODONE HCL 5 MG PO TABS
5.0000 mg | ORAL_TABLET | ORAL | Status: DC | PRN
  Administered 2022-01-09 (×4): 5 mg via ORAL
  Administered 2022-01-10 (×2): 10 mg via ORAL
  Administered 2022-01-10: 5 mg via ORAL
  Administered 2022-01-11 – 2022-01-14 (×7): 10 mg via ORAL
  Filled 2022-01-08 (×2): qty 1
  Filled 2022-01-08: qty 2
  Filled 2022-01-08: qty 1
  Filled 2022-01-08 (×7): qty 2
  Filled 2022-01-08: qty 1
  Filled 2022-01-08 (×2): qty 2
  Filled 2022-01-08: qty 1

## 2022-01-08 MED ORDER — ALBUMIN HUMAN 5 % IV SOLN
250.0000 mL | INTRAVENOUS | Status: DC | PRN
  Administered 2022-01-08 (×2): 12.5 g via INTRAVENOUS
  Filled 2022-01-08: qty 250

## 2022-01-08 MED ORDER — CHLORHEXIDINE GLUCONATE 0.12 % MT SOLN
15.0000 mL | Freq: Once | OROMUCOSAL | Status: AC
  Administered 2022-01-08 – 2022-01-09 (×2): 15 mL via OROMUCOSAL
  Filled 2022-01-08: qty 15

## 2022-01-08 MED ORDER — HEMOSTATIC AGENTS (NO CHARGE) OPTIME
TOPICAL | Status: DC | PRN
  Administered 2022-01-08 (×2): 1 via TOPICAL

## 2022-01-08 MED ORDER — CEFAZOLIN SODIUM-DEXTROSE 2-4 GM/100ML-% IV SOLN
2.0000 g | Freq: Three times a day (TID) | INTRAVENOUS | Status: AC
  Administered 2022-01-08 – 2022-01-10 (×6): 2 g via INTRAVENOUS
  Filled 2022-01-08 (×6): qty 100

## 2022-01-08 MED ORDER — HEPARIN SODIUM (PORCINE) 1000 UNIT/ML IJ SOLN
INTRAMUSCULAR | Status: AC
  Filled 2022-01-08: qty 1

## 2022-01-08 MED ORDER — MIDAZOLAM HCL 2 MG/2ML IJ SOLN: 2.0000 mg | INTRAMUSCULAR | Status: DC | PRN

## 2022-01-08 SURGICAL SUPPLY — 92 items
ADAPTER CARDIO PERF ANTE/RETRO (ADAPTER) ×2 IMPLANT
APPLICATOR TIP COSEAL (VASCULAR PRODUCTS) IMPLANT
ASCENDING AORTIC VALVE 27/29 (Prosthesis & Implant Heart) ×2 IMPLANT
BAG DECANTER FOR FLEXI CONT (MISCELLANEOUS) ×2 IMPLANT
BLADE CLIPPER SURG (BLADE) ×2 IMPLANT
BLADE STERNUM SYSTEM 6 (BLADE) ×2 IMPLANT
BLADE SURG 15 STRL LF DISP TIS (BLADE) ×2 IMPLANT
BLADE SURG 15 STRL SS (BLADE) ×2
CANISTER SUCT 3000ML PPV (MISCELLANEOUS) ×2 IMPLANT
CANNULA ARTERIAL VENT 3/8 20FR (CANNULA) IMPLANT
CANNULA GUNDRY RCSP 15FR (MISCELLANEOUS) ×2 IMPLANT
CANNULA MC2 2 STG 36/46 NON-V (CANNULA) IMPLANT
CANNULA SUMP PERICARDIAL (CANNULA) IMPLANT
CANNULA VENOUS 2 STG 34/46 (CANNULA) ×2
CATH HEART VENT LEFT (CATHETERS) ×2 IMPLANT
CATH ROBINSON RED A/P 18FR (CATHETERS) ×6 IMPLANT
CATH THORACIC 36FR (CATHETERS) ×2 IMPLANT
CATH THORACIC 36FR RT ANG (CATHETERS) ×2 IMPLANT
CNTNR URN SCR LID CUP LEK RST (MISCELLANEOUS) ×2 IMPLANT
CONT SPEC 4OZ STRL OR WHT (MISCELLANEOUS) ×4
CONTAINER PROTECT SURGISLUSH (MISCELLANEOUS) ×2 IMPLANT
DRAPE WARM FLUID 44X44 (DRAPES) IMPLANT
DRESSING QUICKCLOT CNTRL ZFOLD (GAUZE/BANDAGES/DRESSINGS) IMPLANT
DRSG COVADERM 4X14 (GAUZE/BANDAGES/DRESSINGS) ×2 IMPLANT
DRSG QUICKCLOT CNTRL ZFOLD (GAUZE/BANDAGES/DRESSINGS) ×2
ELECT CAUTERY BLADE 6.4 (BLADE) ×2 IMPLANT
ELECT REM PT RETURN 9FT ADLT (ELECTROSURGICAL) ×4
ELECTRODE REM PT RTRN 9FT ADLT (ELECTROSURGICAL) ×4 IMPLANT
FELT TEFLON 1X6 (MISCELLANEOUS) ×2 IMPLANT
GAUZE 4X4 16PLY ~~LOC~~+RFID DBL (SPONGE) ×2 IMPLANT
GAUZE SPONGE 4X4 12PLY STRL (GAUZE/BANDAGES/DRESSINGS) ×2 IMPLANT
GAUZE SPONGE 4X4 12PLY STRL LF (GAUZE/BANDAGES/DRESSINGS) IMPLANT
GLOVE BIO SURGEON STRL SZ 6 (GLOVE) IMPLANT
GLOVE BIO SURGEON STRL SZ 6.5 (GLOVE) IMPLANT
GLOVE BIO SURGEON STRL SZ7 (GLOVE) IMPLANT
GLOVE BIO SURGEON STRL SZ7.5 (GLOVE) IMPLANT
GLOVE BIOGEL PI IND STRL 6 (GLOVE) IMPLANT
GLOVE SURG MICRO LTX SZ7 (GLOVE) ×4 IMPLANT
GOWN STRL REUS W/ TWL LRG LVL3 (GOWN DISPOSABLE) ×8 IMPLANT
GOWN STRL REUS W/ TWL XL LVL3 (GOWN DISPOSABLE) ×2 IMPLANT
GOWN STRL REUS W/TWL LRG LVL3 (GOWN DISPOSABLE) ×8
GOWN STRL REUS W/TWL XL LVL3 (GOWN DISPOSABLE) ×2
GRAFT HEMASHIELD 26X10MM (Vascular Products) IMPLANT
HEMOSTAT POWDER SURGIFOAM 1G (HEMOSTASIS) ×6 IMPLANT
HEMOSTAT SURGICEL 2X14 (HEMOSTASIS) ×2 IMPLANT
INSERT FOGARTY XLG (MISCELLANEOUS) IMPLANT
KIT BASIN OR (CUSTOM PROCEDURE TRAY) ×2 IMPLANT
KIT CATH CPB BARTLE (MISCELLANEOUS) ×2 IMPLANT
KIT SUCTION CATH 14FR (SUCTIONS) ×2 IMPLANT
KIT TURNOVER KIT B (KITS) ×2 IMPLANT
LINE VENT (MISCELLANEOUS) IMPLANT
LOOP VESSEL SUPERMAXI WHITE (MISCELLANEOUS) IMPLANT
LOOP VESSEL SUPMAX 1.2X559 WHT (MISCELLANEOUS) ×2
NS IRRIG 1000ML POUR BTL (IV SOLUTION) ×10 IMPLANT
PACK E OPEN HEART (SUTURE) ×2 IMPLANT
PACK OPEN HEART (CUSTOM PROCEDURE TRAY) ×2 IMPLANT
PAD ARMBOARD 7.5X6 YLW CONV (MISCELLANEOUS) ×4 IMPLANT
POSITIONER HEAD DONUT 9IN (MISCELLANEOUS) ×2 IMPLANT
SEALANT SURG COSEAL 8ML (VASCULAR PRODUCTS) IMPLANT
SET MPS 3-ND DEL (MISCELLANEOUS) IMPLANT
SET VEIN GRAFT PERF (SET/KITS/TRAYS/PACK) IMPLANT
SPONGE T-LAP 18X18 ~~LOC~~+RFID (SPONGE) ×8 IMPLANT
SPONGE T-LAP 4X18 ~~LOC~~+RFID (SPONGE) ×2 IMPLANT
SUT BONE WAX W31G (SUTURE) ×2 IMPLANT
SUT EB EXC GRN/WHT 2-0 D/A SH (SUTURE) ×4
SUT EB EXC GRN/WHT 2-0 V-5 (SUTURE) ×4 IMPLANT
SUT ETHIBON EXCEL 2-0 V-5 (SUTURE) IMPLANT
SUT ETHIBOND V-5 VALVE (SUTURE) IMPLANT
SUT PROLENE 3 0 SH 1 (SUTURE) ×2 IMPLANT
SUT PROLENE 3 0 SH 48 (SUTURE) ×4 IMPLANT
SUT PROLENE 3 0 SH DA (SUTURE) IMPLANT
SUT PROLENE 4 0 RB 1 (SUTURE) ×14
SUT PROLENE 4-0 RB1 .5 CRCL 36 (SUTURE) ×8 IMPLANT
SUT PROLENE 5 0 C 1 36 (SUTURE) IMPLANT
SUT SILK 2 0 SH CR/8 (SUTURE) IMPLANT
SUT STEEL 6MS V (SUTURE) IMPLANT
SUT STEEL STERNAL CCS#1 18IN (SUTURE) IMPLANT
SUT STEEL SZ 6 DBL 3X14 BALL (SUTURE) IMPLANT
SUT VIC AB 1 CTX 36 (SUTURE) ×6
SUT VIC AB 1 CTX36XBRD ANBCTR (SUTURE) ×4 IMPLANT
SUT VIC AB 2-0 CT1 27 (SUTURE)
SUT VIC AB 2-0 CT1 TAPERPNT 27 (SUTURE) IMPLANT
SUT VIC AB 3-0 X1 27 (SUTURE) IMPLANT
SUTURE EB EXC GRN/WHT 2-0 D/A (SUTURE) IMPLANT
SYSTEM SAHARA CHEST DRAIN ATS (WOUND CARE) ×2 IMPLANT
TOWEL GREEN STERILE (TOWEL DISPOSABLE) ×2 IMPLANT
TOWEL GREEN STERILE FF (TOWEL DISPOSABLE) ×2 IMPLANT
TRAY FOLEY SLVR 14FR TEMP STAT (SET/KITS/TRAYS/PACK) ×2 IMPLANT
UNDERPAD 30X36 HEAVY ABSORB (UNDERPADS AND DIAPERS) ×2 IMPLANT
VALVE AORTIC ASCENDING 27/29 (Prosthesis & Implant Heart) IMPLANT
VENT LEFT HEART 12002 (CATHETERS) ×4
WATER STERILE IRR 1000ML POUR (IV SOLUTION) ×4 IMPLANT

## 2022-01-08 NOTE — TOC CM/SW Note (Signed)
..   Transition of Care [facility]) Screening Note   Patient Details  Name: Larry Campos. Date of Birth: [birth year]   Transition of Care [facility]) CM/SW Contact:    Elliot Cousin, RN Phone Number: [phone] 01/08/2022, 2:32 PM    Transition of Care Department [facility]) has reviewed patient. We will continue to monitor patient advancement through interdisciplinary progression rounds. Will continue to follow for dc needs. Isidoro Donning RN3 CCM, Heart Failure TOC CM [phone]

## 2022-01-08 NOTE — Hospital Course (Addendum)
PCP is [facility] Referring Provider is Early Osmond, MD  Requested f/u appt with [facility] Needs appt's for cardiology and Coumadin Clinic   History of Present Illness:  The patient is a 26 year old gentleman with a history of a heart murmur since childhood who is now followed by Dr. Domenic Polite.  He had an echocardiogram in February 2022 showing moderate aortic insufficiency with what appeared to be a unicuspid aortic valve.  There is a mean gradient of 23 mmHg across the aortic valve with a peak gradient of 41 mmHg.  There was no mitral regurgitation.  Left ventricular ejection fraction is 55 to 60%.  He had a follow-up 2D echo in March 2023 which showed moderate to severe aortic insufficiency with pressure half-time of 380 ms.  There was no clear flow reversal in the aortic arch.  The mean gradient across the aortic valve was 27 mmHg consistent with moderate aortic stenosis.  Left ventricular ejection fraction was 55 to 60% with an increase in left ventricular diastolic dimension to 9.32 cm compared to the previous echo when it was 4.99 cm.  A cardiac MRI on 09/01/2021 showed severe aortic insufficiency with a regurgitant fraction of 55%.  The aortic valve appeared to be bicuspid.  Ventricular ejection fraction was 55%.  Right ventricular size and function were normal.  The aortic root at the sinuses of Valsalva measured 4.3 cm and the ascending aorta measured 4.2 cm.  He subsequent underwent cardiac catheterization on 12/18/2021 showing normal coronary anatomy.  There is no obstructive coronary disease.  Right and left heart pressures were normal with a mean gradient of 31 mmHg across the aortic valve.  A CTA of the chest on 12/11/2021 showed a dilated aortic root with a diameter of 4.8 cm and an ascending aortic aneurysm measuring 5 cm in diameter.  The proximal aortic arch measured 3.3 cm and the distal arch 2.4 cm.  The descending thoracic aorta measured 2.3 cm.  There is no evidence of  aortic dissection.   He is married and lives with his wife and child.  He works for [employer].  He is a non-smoker.  He reports occasional episodes of atypical chest discomfort.  He has had some episodes of lightheadedness but no syncope.  He does report some exertional shortness of breath but no fatigue.  He denies any peripheral edema.  This 26 year old gentleman has a bicuspid aortic valve with moderate aortic stenosis and severe aortic insufficiency with a 4.8 cm aortic root and 5 cm fusiform ascending aortic aneurysm.  He has NYHA class II symptoms of exertional shortness of breath as well as occasional episodes of dizziness and chest discomfort.  His echocardiogram shows an increase in the left ventricular diastolic dimension.  I think it is time to proceed with replacement of his aortic valve and ascending aortic aneurysm to prevent left ventricular deterioration and aortic dissection.  I have reviewed the studies with him and his wife.  I recommended Bentall procedure using a mechanical valve conduit with replacement of the aorta to the proximal aortic arch.  This will require a period of circulatory arrest.  He has no contraindications to anticoagulation and feels that he could be compliant with taking Coumadin. I discussed the operative procedure with the patient and his wife including alternatives, benefits and risks; including but not limited to bleeding, blood transfusion, infection, stroke, myocardial infarction,heart block requiring a permanent pacemaker, organ dysfunction, and death.  [name] understands and agrees to proceed.  Hospital Course: Mr. Coran was admitted to the hospital for elective surgery on 01/08/2022.  He was taken to the operative room where Bentall procedure was performed utilizing a 27/29 mm On-X mechanical valved conduit.  Following procedure, he separated from cardiopulmonary bypass without difficulty and requiring no inotropic support.  He was transferred  to the surgical ICU in stable condition intubated and mechanically ventilated.  He was extubated the evening of surgery.  He had issues with post operative nausea.  His post operative EKG showed a new LBBB.  He was started on diuretics for volume overloaded state.  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  His chest tubes, pacing wires, and arterial lines were removed without difficulty.  He was tachycardic and started on low dose Lopressor, however his BP was too low for additional titration. CXR showed left sided basilar atelectasis, increased incentive spirometry and ambulation was encouraged. He was felt stable for transfer to the progressive care unit on 01/10/2022.  The patient continued to make progress. He met his preoperative weight and was no longer fluid overloaded so diuresis was discontinued. His sinus tachycardia persisted so he was given a trial of increased lopressor. His INR was 1.9 on 2.5 mg of coumadin with an INR goal of 2.5. He was ambulating well. His incisions were healing well without sign of infection.  He has been tolerating a diet and has had a bowel movement. INR is up to 2.* on 11/19. He is felt surgically stable for discharge today

## 2022-01-08 NOTE — Discharge Summary (Signed)
[address]       [place] [address]             [phone]    Physician Discharge Summary  Patient ID: Larry Campos. MRN: [id number] DOB/AGE: 1995-11-28 26 y.o.  Admit date: 01/08/2022 Discharge date: 01/14/2022  Admission Diagnoses:  Patient Active Problem List   Diagnosis Date Noted   AI (aortic insufficiency) 12/28/2021   Mitral regurgitation 12/28/2021   Thoracic aortic aneurysm without rupture ([place]) 12/28/2021   Open displaced fracture of fourth metacarpal bone of left hand 12/06/2018   Discharge Diagnoses:  Patient Active Problem List   Diagnosis Date Noted   S/P ascending aortic aneurysm repair 01/08/2022   AI (aortic insufficiency) 12/28/2021   Mitral regurgitation 12/28/2021   Thoracic aortic aneurysm without rupture ([place]) 12/28/2021   Open displaced fracture of fourth metacarpal bone of left hand 12/06/2018     Discharged Condition: stable    PCP is [facility] Referring Provider is Early Osmond, MD  Requested f/u appt with [facility] Needs appt's for cardiology and Coumadin Clinic   History of Present Illness:  The patient is a 26 year old gentleman with a history of a heart murmur since childhood who is now followed by Dr. Domenic Polite.  He had an echocardiogram in February 2022 showing moderate aortic insufficiency with what appeared to be a unicuspid aortic valve.  There is a mean gradient of 23 mmHg across the aortic valve with a peak gradient of 41 mmHg.  There was no mitral regurgitation.  Left ventricular ejection fraction is 55 to 60%.  He had a follow-up 2D echo in March 2023 which showed moderate to severe aortic insufficiency with pressure half-time of 380 ms.  There was no clear flow reversal in the aortic arch.  The mean gradient across the aortic valve was 27 mmHg consistent with moderate aortic stenosis.  Left ventricular ejection fraction was 55 to 60% with an increase in left ventricular diastolic  dimension to 2.13 cm compared to the previous echo when it was 4.99 cm.  A cardiac MRI on 09/01/2021 showed severe aortic insufficiency with a regurgitant fraction of 55%.  The aortic valve appeared to be bicuspid.  Ventricular ejection fraction was 55%.  Right ventricular size and function were normal.  The aortic root at the sinuses of Valsalva measured 4.3 cm and the ascending aorta measured 4.2 cm.  He subsequent underwent cardiac catheterization on 12/18/2021 showing normal coronary anatomy.  There is no obstructive coronary disease.  Right and left heart pressures were normal with a mean gradient of 31 mmHg across the aortic valve.  A CTA of the chest on 12/11/2021 showed a dilated aortic root with a diameter of 4.8 cm and an ascending aortic aneurysm measuring 5 cm in diameter.  The proximal aortic arch measured 3.3 cm and the distal arch 2.4 cm.  The descending thoracic aorta measured 2.3 cm.  There is no evidence of aortic dissection.   He is married and lives with his wife and child.  He works for [employer].  He is a non-smoker.  He reports occasional episodes of atypical chest discomfort.  He has had some episodes of lightheadedness but no syncope.  He does report some exertional shortness of breath but no fatigue.  He denies any peripheral edema.  This 26 year old gentleman has a bicuspid aortic valve with moderate aortic stenosis and severe aortic insufficiency with a 4.8 cm aortic root and 5 cm fusiform ascending  aortic aneurysm.  He has NYHA class II symptoms of exertional shortness of breath as well as occasional episodes of dizziness and chest discomfort.  His echocardiogram shows an increase in the left ventricular diastolic dimension.  I think it is time to proceed with replacement of his aortic valve and ascending aortic aneurysm to prevent left ventricular deterioration and aortic dissection.  I have reviewed the studies with him and his wife.  I recommended Bentall procedure using a mechanical  valve conduit with replacement of the aorta to the proximal aortic arch.  This will require a period of circulatory arrest.  He has no contraindications to anticoagulation and feels that he could be compliant with taking Coumadin. I discussed the operative procedure with the patient and his wife including alternatives, benefits and risks; including but not limited to bleeding, blood transfusion, infection, stroke, myocardial infarction,heart block requiring a permanent pacemaker, organ dysfunction, and death.  [name] understands and agrees to proceed.      Hospital Course: Mr. Livermore was admitted to the hospital for elective surgery on 01/08/2022.  He was taken to the operative room where Bentall procedure was performed utilizing a 27/29 mm On-X mechanical valved conduit.  Following procedure, he separated from cardiopulmonary bypass without difficulty and requiring no inotropic support.  He was transferred to the surgical ICU in stable condition intubated and mechanically ventilated.  He was extubated the evening of surgery.  He had issues with post operative nausea.  His post operative EKG showed a new LBBB.  He was started on diuretics for volume overloaded state.  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  His chest tubes, pacing wires, and arterial lines were removed without difficulty.  He was tachycardic and started on low dose Lopressor, however his BP was too low for additional titration. CXR showed left sided basilar atelectasis, increased incentive spirometry and ambulation was encouraged. He was felt stable for transfer to the progressive care unit on 01/10/2022.  The patient continued to make progress. He met his preoperative weight and was no longer fluid overloaded so diuresis was discontinued. His sinus tachycardia persisted so he was given a trial of increased Lopressor to 25 mg bid. His INR was 1.9 on 2.5 mg of Coumadin with an INR goal of 2.5. He was ambulating well.  His incisions were healing well without sign of infection. He has been tolerating a diet and has had a bowel movement.  INR is up to 2.4 on 11/19. He will be given 5 mg of Coumadin prior to discharge. He will likely need a combination of 2.5 mg and 5 mg after discharge. PT/INR appointment was arranged for Monday 11/20. He is felt surgically stable for discharge today   Consults: None  Significant Diagnostic Studies: cardiac graphics:   Echocardiogram:    IMPRESSIONS     1. Left ventricular ejection fraction, by estimation, is 55 to 60%. The  left ventricle has normal function. The left ventricle has no regional  wall motion abnormalities. Left ventricular diastolic parameters were  normal. The average left ventricular  global longitudinal strain is -19.3 %. The global longitudinal strain is  normal.   2. Right ventricular systolic function is normal. The right ventricular  size is normal. There is normal pulmonary artery systolic pressure. The  estimated right ventricular systolic pressure is 50.3 mmHg.   3. The mitral valve is grossly normal. Trivial mitral valve  regurgitation.   4. The aortic valve is abnormal. Described as unicuspid  previously and  does appear that way in some views, in others more bicuspid. Aortic valve  regurgitation is moderate to severe, eccentric (no clear flow reversal in  aortic arch). Moderate aortic  valve stenosis. Aortic regurgitation PHT measures 380 msec. Aortic valve  area, by VTI measures 1.32 cm. Aortic valve mean gradient measures 26.8  mmHg. Dimentiionless index 0.42.   5. Aortic dilatation noted. There is borderline dilatation of the aortic  root, measuring 39 mm.   6. The inferior vena cava is normal in size with greater than 50%  respiratory variability, suggesting right atrial pressure of 3 mmHg.    Treatments: surgery:    CARDIOVASCULAR SURGERY OPERATIVE NOTE   01/08/2022   Surgeon:  Gaye Pollack, MD   First Assistant: Enid Cutter,  PA-C: An experienced assistant was required given the complexity of this surgery and the standard of surgical care. The assistant was needed for exposure, dissection, suctioning, retraction of delicate tissues and sutures, instrument exchange and for overall help during this procedure.      Preoperative Diagnosis:  Severe bicuspid aortic valve insufficiency and moderate stenosis with 5cm ascending aortic aneurysm     Postoperative Diagnosis:  Same     Procedure:   Median Sternotomy Extracorporeal circulation 3.   Replacement of ascending aortic aneurysm ( hemi-arch) using a 26 mm Hemashield Platinum graft under deep hypothermic circulatory arrest. 4.   Bentall procedure using a 27/29 mm On-X Mechanical valve conduit with reimplantation of coronary arteries.     Discharge Exam: Blood pressure 104/65, pulse (!) 107, temperature 99.2 F (37.3 C), temperature source Oral, resp. rate 20, height _0  (1.727 m), weight 60 kg, SpO2 100 %. Cardiovascular: RRR, sharp valve click Pulmonary: Clear to auscultation bilaterally Abdomen: Soft, non tender, bowel sounds present. Extremities: No lower extremity edema. Wound: Clean and dry.  No erythema or signs of infection.     Discharge Medications:  The patient has been discharged on:   1.Beta Blocker:  Yes [  x ]                              No   [   ]                              If No, reason:  2.Ace Inhibitor/ARB: Yes [   ]                                     No  [   x ]                                     If No, reason: hypotension  3.Statin:   Yes [   ]                  No  [  x ]                  If No, reason: No hx of CAD  4.[place]:  Yes  [  x ]                  No   [   ]  If No, reason:  Patient had ACS upon admission: No  Plavix/P2Y12 inhibitor: Yes [   ]                                      No  [  x ]     Discharge Instructions     Amb Referral to Cardiac Rehabilitation   Complete  by: As directed    Diagnosis: Other   After initial evaluation and assessments completed: Virtual Based Care may be provided alone or in conjunction with Phase 2 Cardiac Rehab based on patient barriers.: Yes   Intensive Cardiac Rehabilitation (ICR) [facility] location only OR Traditional Cardiac Rehabilitation (TCR) *If criteria for ICR are not met will enroll in TCR [facility] only): Yes      Allergies as of 01/14/2022       Reactions   Amoxicillin Hives   Penicillins Itching   Did it involve swelling of the face/tongue/throat, SOB, or low BP? No Did it involve sudden or severe rash/hives, skin peeling, or any reaction on the inside of your mouth or nose? No Did you need to seek medical attention at a hospital or doctor's office? No When did it last happen?       If all above answers are "NO", may proceed with cephalosporin use.        Medication List     TAKE these medications    aspirin EC 81 MG tablet Take 1 tablet (81 mg total) by mouth daily. Swallow whole.   cetirizine 10 MG tablet Commonly known as: ZYRTEC Take 10 mg by mouth in the morning.   ibuprofen 200 MG tablet Commonly known as: ADVIL Take 600 mg by mouth every 8 (eight) hours as needed (pain.).   metoprolol tartrate 25 MG tablet Commonly known as: LOPRESSOR Take 1 tablet (25 mg total) by mouth 2 (two) times daily.   oxyCODONE 5 MG immediate release tablet Commonly known as: Oxy IR/ROXICODONE Take 1 tablet (5 mg total) by mouth every 6 (six) hours as needed for severe pain.   VITAMIN D PO Take 2,000 Units by mouth in the morning.   warfarin 2.5 MG tablet Commonly known as: COUMADIN Take 1 tablet (2.5 mg total) by mouth daily at 4 PM. Or take as directed Start taking on: January 15, 2022        Follow-up Information     Gaye Pollack, MD Follow up on 02/07/2022.   Specialty: Cardiothoracic Surgery Why: To get chest xray at [facility] imaging (on the first floor of Dr. Vivi Martens office building) at  11:30AM, 30 minutes before you appointment at 12:00PM. Contact information: [address] [place] [address] [place]. Go on 01/22/2022.   Specialty: Cardiothoracic Surgery Why: Nurse visit for chest tube suture removal. Office will contact you with appointment time. Contact information: [place], [place] [phone]        Finis Bud, NP Follow up on 01/29/2022.   Specialty: Nurse Practitioner Why: Cardiology follow up appointment at 8:30AM Contact information: [place] [address] [phone]         [facility] Follow up on 02/21/2022.   Why: Echocardiogram at 8:30AM Contact information: [address] [place] [address] [phone]  [facility] [place] Follow up on 01/15/2022.   Specialty: Cardiology Why: Coumadin clinic appointment for PT/INR (has On X mechanical AVR) to be checked. Appointment time is at 10:00AM Contact information: [place] [id number] [place] [phone]                Signed:  Nani Skillern, PA-C  01/14/2022, 11:01 AM

## 2022-01-08 NOTE — Progress Notes (Signed)
      [address]       [name] [address]             [phone]      S/p Bentall  Intubated, starting to wake up Vent wean in progress  BP (!) 90/57   Pulse 89   Temp 99.1 F (37.3 C)   Resp 10   Ht 5\' 8"  (1.727 m)   Wt 62.1 kg   SpO2 100%   BMI 20.83 kg/m  20/12 CI 2.3   Intake/Output Summary (Last 24 hours) at 01/08/2022 1733 Last data filed at 01/08/2022 1700 Gross per 24 hour  Intake 4802.99 ml  Output 2960 ml  Net 1842.99 ml   Minimal CT output  Doing well early postop  01/10/2022 C. Viviann Spare, MD [facility] [phone]

## 2022-01-08 NOTE — Anesthesia Procedure Notes (Addendum)
Arterial Line Insertion Start/End11/13/2023 7:00 AM, 01/08/2022 7:05 AM Performed by: Audie Pinto, CRNA, CRNA  Patient location: Pre-op. Preanesthetic checklist: patient identified, IV checked, site marked, risks and benefits discussed, surgical consent, monitors and equipment checked, pre-op evaluation, timeout performed and anesthesia consent Lidocaine 1% used for infiltration and patient sedated Left, radial was placed Catheter size: 20 G Hand hygiene performed   Attempts: 1 Procedure performed without using ultrasound guided technique. Following insertion, dressing applied and Biopatch. Post procedure assessment: unchanged  Patient tolerated the procedure well with no immediate complications. Additional procedure comments: Placed by SRNA - laterality discussed with [place] prior to placement.

## 2022-01-08 NOTE — Anesthesia Postprocedure Evaluation (Signed)
Anesthesia Post Note  Patient: Larry Campos.  Procedure(s) Performed: BENTALL PROCEDURE (Chest) TRANSESOPHAGEAL ECHOCARDIOGRAM (TEE)     Patient location during evaluation: PACU Anesthesia Type: General Level of consciousness: sedated and patient remains intubated per anesthesia plan Pain management: pain level controlled Vital Signs Assessment: post-procedure vital signs reviewed and stable Respiratory status: patient remains intubated per anesthesia plan and patient on ventilator - see flowsheet for VS Cardiovascular status: stable Anesthetic complications: no   No notable events documented.  Last Vitals:  Vitals:   01/08/22 1445 01/08/22 1500  BP: 106/73 109/72  Pulse: 85 82  Resp: 12 12  Temp: (!) 36 C (!) 36 C  SpO2: 100% 100%    Last Pain:  Vitals:   01/08/22 1400  TempSrc: Core  PainSc:                  Larry Campos

## 2022-01-08 NOTE — Discharge Instructions (Addendum)
Discharge Instructions:  1. You may shower, please wash incisions daily with soap and water and keep dry.  If you wish to cover wounds with dressing you may do so but please keep clean and change daily.  No tub baths or swimming until incisions have completely healed.  If your incisions become red or develop any drainage please call our office at [phone]  2. No Driving until cleared by Dr. Bartle's office and you are no longer using narcotic pain medications  3. Monitor your weight daily.. Please use the same scale and weigh at same time... If you gain 5-10 lbs in 48 hours with associated lower extremity swelling, please contact our office at [phone]  4. Fever of 101.5 for at least 24 hours with no source, please contact our office at [phone]  5. Activity- up as tolerated, please walk at least 3 times per day.  Avoid strenuous activity, no lifting, pushing, or pulling with your arms over 8-10 lbs for a minimum of 6 weeks  6. If any questions or concerns arise, please do not hesitate to contact our office at [phone]    Information on my medicine - Coumadin   (Warfarin)  This medication education was reviewed with me or my healthcare representative as part of my discharge preparation.  Why was Coumadin prescribed for you? Coumadin was prescribed for you because you have a blood clot or a medical condition that can cause an increased risk of forming blood clots. Blood clots can cause serious health problems by blocking the flow of blood to the heart, lung, or brain. Coumadin can prevent harmful blood clots from forming. As a reminder your indication for Coumadin is:  Blood Clot Prevention after Heart Valve Surgery  What test will check on my response to Coumadin? While on Coumadin (warfarin) you will need to have an INR test regularly to ensure that your dose is keeping you in the desired range. The INR (international normalized ratio) number is calculated from the result  of the laboratory test called prothrombin time (PT).  If an INR APPOINTMENT HAS NOT ALREADY BEEN MADE FOR YOU please schedule an appointment to have this lab work done by your health care provider within 7 days. Your INR goal is usually a number between:  2 to 3 or your provider may give you a more narrow range like 2-2.5.  Ask your health care provider during an office visit what your goal INR is.  What  do you need to  know  About  COUMADIN? Take Coumadin (warfarin) exactly as prescribed by your healthcare provider about the same time each day.  DO NOT stop taking without talking to the doctor who prescribed the medication.  Stopping without other blood clot prevention medication to take the place of Coumadin may increase your risk of developing a new clot or stroke.  Get refills before you run out.  What do you do if you miss a dose? If you miss a dose, take it as soon as you remember on the same day then continue your regularly scheduled regimen the next day.  Do not take two doses of Coumadin at the same time.  Important Safety Information A possible side effect of Coumadin (Warfarin) is an increased risk of bleeding. You should call your healthcare provider right away if you experience any of the following: Bleeding from an injury or your nose that does not stop. Unusual colored urine (red or dark brown) or unusual colored stools (red or black). Unusual   bruising for unknown reasons. A serious fall or if you hit your head (even if there is no bleeding).  Some foods or medicines interact with Coumadin (warfarin) and might alter your response to warfarin. To help avoid this: Eat a balanced diet, maintaining a consistent amount of Vitamin K. Notify your provider about major diet changes you plan to make. Avoid alcohol or limit your intake to 1 drink for women and 2 drinks for men per day. (1 drink is 5 oz. wine, 12 oz. beer, or 1.5 oz. liquor.)  Make sure that ANY health care provider who  prescribes medication for you knows that you are taking Coumadin (warfarin).  Also make sure the healthcare provider who is monitoring your Coumadin knows when you have started a new medication including herbals and non-prescription products.  Coumadin (Warfarin)  Major Drug Interactions  Increased Warfarin Effect Decreased Warfarin Effect  Alcohol (large quantities) Antibiotics (esp. Septra/Bactrim, Flagyl, Cipro) Amiodarone (Cordarone) Aspirin (ASA) Cimetidine (Tagamet) Megestrol (Megace) NSAIDs (ibuprofen, naproxen, etc.) Piroxicam (Feldene) Propafenone (Rythmol SR) Propranolol (Inderal) Isoniazid (INH) Posaconazole (Noxafil) Barbiturates (Phenobarbital) Carbamazepine (Tegretol) Chlordiazepoxide (Librium) Cholestyramine (Questran) Griseofulvin Oral Contraceptives Rifampin Sucralfate (Carafate) Vitamin K   Coumadin (Warfarin) Major Herbal Interactions  Increased Warfarin Effect Decreased Warfarin Effect  Garlic Ginseng Ginkgo biloba Coenzyme Q10 Green tea St. John's wort    Coumadin (Warfarin) FOOD Interactions  Eat a consistent number of servings per week of foods HIGH in Vitamin K (1 serving =  cup)  Collards (cooked, or boiled & drained) Kale (cooked, or boiled & drained) Mustard greens (cooked, or boiled & drained) Parsley *serving size only =  cup Spinach (cooked, or boiled & drained) Swiss chard (cooked, or boiled & drained) Turnip greens (cooked, or boiled & drained)  Eat a consistent number of servings per week of foods MEDIUM-HIGH in Vitamin K (1 serving = 1 cup)  Asparagus (cooked, or boiled & drained) Broccoli (cooked, boiled & drained, or raw & chopped) Brussel sprouts (cooked, or boiled & drained) *serving size only =  cup Lettuce, raw (green leaf, endive, romaine) Spinach, raw Turnip greens, raw & chopped   These websites have more information on Coumadin (warfarin):  www.coumadin.com; www.ahrq.gov/consumer/coumadin.htm;    

## 2022-01-08 NOTE — Progress Notes (Signed)
Patient ID: Larry Coder., male   DOB: 12-28-1995, 26 y.o.   MRN: [id number]  TCTS Evening Rounds:   Hemodynamically stable in sinus rhythm. CI = 2.3  Has started to wake up on vent.   Urine output good  CT output low  CBC    Component Value Date/Time   WBC 17.4 (H) 01/08/2022 1345   RBC 3.87 (L) 01/08/2022 1345   HGB 10.9 (L) 01/08/2022 1346   HGB 15.1 12/04/2021 1030   HCT 32.0 (L) 01/08/2022 1346   HCT 43.7 12/04/2021 1030   PLT 95 (L) 01/08/2022 1345   PLT 218 12/04/2021 1030   MCV 89.4 01/08/2022 1345   MCV 89 12/04/2021 1030   MCH 30.5 01/08/2022 1345   MCHC 34.1 01/08/2022 1345   RDW 12.0 01/08/2022 1345   RDW 13.1 12/04/2021 1030     BMET    Component Value Date/Time   NA 141 01/08/2022 1346   NA 140 12/04/2021 1030   K 3.6 01/08/2022 1346   CL 100 01/08/2022 1237   CO2 21 (L) 01/04/2022 0905   GLUCOSE 182 (H) 01/08/2022 1237   BUN 15 01/08/2022 1237   BUN 17 12/04/2021 1030   CREATININE 0.70 01/08/2022 1237   CALCIUM 9.3 01/04/2022 0905   EGFR 107 12/04/2021 1030   GFRNONAA >60 01/04/2022 0905     A/P:  Stable postop course. Continue current plans

## 2022-01-08 NOTE — Brief Op Note (Signed)
01/08/2022  12:19 PM  PATIENT:  Larry Campos.  26 y.o. male  PRE-OPERATIVE DIAGNOSIS: Thoracic Aortic Aneurysm, Unicudpid aortic Valve, Moderate Aortic Stenosis, Moderate to Severe Aortic insufficiency  POST-OPERATIVE DIAGNOSIS:  Thoracic Aortic Aneurysm, Unicudpid aortic Valve, Moderate Aortic Stenosis, Moderate to Severe Aortic insufficiency  PROCEDURE:   BENTALL PROCEDURE with a 27/63mm ON-X Mechanical Valved Conduit using Hypothermic Circulatory Arrest   TRANSESOPHAGEAL ECHOCARDIOGRAM   SURGEON: [name], MD - Primary  PHYSICIAN ASSISTANT: Mikiah Demond  ASSISTANTS: Bruins, Brooklyn L, Scrub Person                     Tresckow, Aurther Loft, RN, Producer, television/film/video   ANESTHESIA:   general  EBL:  BLOOD ADMINISTERED: Cell Saver , Cryo 1 unit  DRAINS:  Mediastinal drains    LOCAL MEDICATIONS USED:  NONE  SPECIMEN:  Aortic valve leaflets, portion of dilated ascending aorta  DISPOSITION OF SPECIMEN:  PATHOLOGY  COUNTS:  YES  DICTATION: .Dragon Dictation  PLAN OF CARE: Admit to inpatient   PATIENT DISPOSITION:  ICU - intubated and hemodynamically stable.   Delay start of Pharmacological VTE agent (>24hrs) due to surgical blood loss or risk of bleeding: yes

## 2022-01-08 NOTE — Anesthesia Procedure Notes (Signed)
Central Venous Catheter Insertion Performed by: [name], MD, anesthesiologist Start/End11/13/2023 7:05 AM, 01/08/2022 7:10 AM Patient location: Pre-op. Preanesthetic checklist: patient identified, IV checked, site marked, risks and benefits discussed, surgical consent, monitors and equipment checked, pre-op evaluation, timeout performed and anesthesia consent Hand hygiene performed  and maximum sterile barriers used  PA cath was placed.Swan type:thermodilution PA Cath depth:51 Procedure performed without using ultrasound guided technique. Attempts: 1 Post procedure assessment: no air and free fluid flow  Patient tolerated the procedure well with no immediate complications.

## 2022-01-08 NOTE — Anesthesia Procedure Notes (Signed)
Procedure Name: Intubation Date/Time: 01/08/2022 7:45 AM  Performed by: Janene Harvey, CRNAPre-anesthesia Checklist: Patient identified, Emergency Drugs available, Suction available and Patient being monitored Patient Re-evaluated:Patient Re-evaluated prior to induction Oxygen Delivery Method: Circle system utilized Preoxygenation: Pre-oxygenation with 100% oxygen Induction Type: IV induction Ventilation: Mask ventilation without difficulty Laryngoscope Size: Mac and 3 Grade View: Grade I Tube type: Oral Tube size: 8.0 mm Number of attempts: 1 Airway Equipment and Method: Stylet and Oral airway Placement Confirmation: ETT inserted through vocal cords under direct vision, positive ETCO2 and breath sounds checked- equal and bilateral Secured at: 24 cm Tube secured with: Tape Dental Injury: Teeth and Oropharynx as per pre-operative assessment

## 2022-01-08 NOTE — Interval H&P Note (Signed)
History and Physical Interval Note:  01/08/2022 6:44 AM  Larry Campos.  has presented today for surgery, with the diagnosis of AI TAA.  The various methods of treatment have been discussed with the patient and family. After consideration of risks, benefits and other options for treatment, the patient has consented to  Procedure(s) with comments: BENTALL PROCEDURE (N/A) - CIRC ARREST TRANSESOPHAGEAL ECHOCARDIOGRAM (TEE) (N/A) as a surgical intervention.  The patient's history has been reviewed, patient examined, no change in status, stable for surgery.  I have reviewed the patient's chart and labs.  Questions were answered to the patient's satisfaction.     [name]

## 2022-01-08 NOTE — Progress Notes (Signed)
  Echocardiogram 2D Echocardiogram has been performed.  Larry Campos M 01/08/2022, 9:43 AM

## 2022-01-08 NOTE — Anesthesia Procedure Notes (Signed)
Central Venous Catheter Insertion Performed by: [name], MD, anesthesiologist Start/End11/13/2023 6:50 AM, 01/08/2022 7:05 AM Patient location: Pre-op. Preanesthetic checklist: patient identified, IV checked, site marked, risks and benefits discussed, surgical consent, monitors and equipment checked, pre-op evaluation, timeout performed and anesthesia consent Position: Trendelenburg Lidocaine 1% used for infiltration and patient sedated Hand hygiene performed  and maximum sterile barriers used  Catheter size: 9 Fr Sheath introducer Procedure performed using ultrasound guided technique. Ultrasound Notes:anatomy identified, needle tip was noted to be adjacent to the nerve/plexus identified, no ultrasound evidence of intravascular and/or intraneural injection and image(s) printed for medical record Attempts: 1 Following insertion, line sutured, dressing applied and Biopatch. Post procedure assessment: blood return through all ports, free fluid flow and no air  Patient tolerated the procedure well with no immediate complications.

## 2022-01-08 NOTE — Procedures (Signed)
Extubation Procedure Note  Patient Details:   Name: Larry Campos. DOB: 1996-02-20 MRN: [id number]   Airway Documentation:    Vent end date: 01/08/22 Vent end time: 1748   Evaluation  O2 sats: stable throughout Complications: No apparent complications Patient did tolerate procedure well. Bilateral Breath Sounds: Clear, Diminished   Yes  Patient was extubated to a 4L [place] without any complications, dyspnea or stridor noted. VC: 1.5L, Dr. Dorris Fetch made aware by RN of NIF being -15. Verbal orders were given to continue with extubation. Positive cuff leak prior to extubation.   Carlynn Spry 01/08/2022, 5:48 PM

## 2022-01-08 NOTE — Op Note (Signed)
CARDIOVASCULAR SURGERY OPERATIVE NOTE  01/08/2022  Surgeon:  [name], MD  First Assistant: Jillyn Hidden,  PA-C: An experienced assistant was required given the complexity of this surgery and the standard of surgical care. The assistant was needed for exposure, dissection, suctioning, retraction of delicate tissues and sutures, instrument exchange and for overall help during this procedure.    Preoperative Diagnosis:  Severe bicuspid aortic valve insufficiency and moderate stenosis with 5cm ascending aortic aneurysm   Postoperative Diagnosis:  Same   Procedure:  Median Sternotomy Extracorporeal circulation 3.   Replacement of ascending aortic aneurysm ( hemi-arch) using a 26 mm Hemashield Platinum graft under deep hypothermic circulatory arrest. 4.   Bentall procedure using a 27/29 mm On-X Mechanical valve conduit with reimplantation of coronary arteries.  Anesthesia:  General Endotracheal   Clinical History/Surgical Indication:  This 26 year old gentleman has a bicuspid aortic valve with moderate aortic stenosis and severe aortic insufficiency with a 4.8 cm aortic root and 5 cm fusiform ascending aortic aneurysm.  He has NYHA class II symptoms of exertional shortness of breath as well as occasional episodes of dizziness and chest discomfort.  His echocardiogram shows an increase in the left ventricular diastolic dimension.  I think it is time to proceed with replacement of his aortic valve and ascending aortic aneurysm to prevent left ventricular deterioration and aortic dissection.  I have reviewed the studies with him and his wife.  I recommended Bentall procedure using a mechanical valve conduit with replacement of the aorta to the proximal aortic arch.  This will require a period of circulatory arrest.  He has no contraindications to anticoagulation and feels that he could be compliant with taking Coumadin. I discussed the operative procedure with the patient and his wife  including alternatives, benefits and risks; including but not limited to bleeding, blood transfusion, infection, stroke, myocardial infarction,heart block requiring a permanent pacemaker, organ dysfunction, and death.  Larry Campos. understands and agrees to proceed.   Preparation:  The patient was seen in the preoperative holding area and the correct patient, correct operation were confirmed with the patient after reviewing the medical record and catheterization. The consent was signed by me. Preoperative antibiotics were given. A pulmonary arterial line and left radial arterial line were placed by the anesthesia team. The patient was taken back to the operating room and positioned supine on the operating room table. After being placed under general endotracheal anesthesia by the anesthesia team a foley catheter was placed. The neck, chest, abdomen, and both legs were prepped with betadine soap and solution and draped in the usual sterile manner. A surgical time-out was taken and the correct patient and operative procedure were confirmed with the nursing and anesthesia staff.  TEE: showed a thickened bicuspid aortic valve with severe AI, moderate AS. LVEF was 55% with moderate LV dilation. RV normal. Trivial MR.  Cardiopulmonary Bypass:  A median sternotomy was performed. The pericardium was opened in the midline. Right ventricular function appeared normal. The ascending aorta was markedly aneurysmal and  had no palpable plaque. It returned to normal size just proximal to the innominate artery. There were no contraindications to aortic cannulation or cross-clamping. The patient was fully systemically heparinized and the ACT was maintained > 400 sec. The distal ascending aorta was cannulated with a 20 F aortic cannula for arterial inflow. Venous cannulation was performed via the right atrial appendage using a two-staged venous cannula. CO2 was insufflated into the pericardium throughout the case to  minimize  intracardiac air.    Resection and grafting of ascending aortic aneurysm:  The patient was placed on cardiopulmonary bypass and a left ventricular vent was placed via the right superior pulmonary vein. Systemic cooling was begun with a goal temperature of 18 degrees centigrade by bladder and rectal temperature probes. A retrograde cardioplegia cannula was placed through the right atrium into the coronary sinus without difficulty. A retrograde cerebral perfusion cannula was placed into the SVC through a pursestring suture and the SVC was encircled with a silastic tape. While cooling, the heart fibrillated and even with the LV vent on high the LV begain to distend with a gradual rise in PA pressures. Therefore the aorta was cross clamped and a dose of cold retrograde KBC cardioplegia ( 1000 cc) was given with electrical silence. Additional doses were given at 60 minute intervals.  After 20 minutes of cooling the target temperature of 18 degrees centigrade was reached. Cerebral oximetry was 70% bilaterally. BIS was near zero. The patient was given Propofol and 125 mg of Solumedrol. The head was packed in ice. The bed was placed in steep trendelenburg. Circulatory arrest was begun and the blood volume emptied into the venous reservoir. Continuous retrograde cerebral perfusion was begun and the SVC occluded with the silastic tape. The aortic cannula was removed. The aorta was transected just proximal to the innominate artery beveling the resection out along the undersurface of the aortic arch (Hemiarch replacement). The aortic diameter was measured at 26 mm here. A 26x 10 mm Hemasheild Platinum vascular graft was prepared. ( REF # [id number] P0, Lot # [id number], SN [id number]). It was anastomosed to the aortic arch in an end to end manner using 3-0 prolene continuous suture with a felt strip to reinforce the anastomisis. A light coating of CoSeal was applied to seal needle holes. The arterial end of the  bypass circuit was then connected to the 10mm side arm graft and circulation was slowly resumed. The tape was removed from the SVC. The aortic graft was cross-clamped proximal to the side arm graft and full CPB support was resumed. Circulatory arrest time was 23 minutes. Retrograde cerebral perfusion time was 22 minutes. The patient was rewarmed to 32 degrees centigrade.    Bentall Procedure:   The ascending aorta was mobilized from the right pulmonary artery and main PA. It was opened longitudinally and the valve inspected. This was a type 2 bicuspid with fusion of the left/right and right/non leaflets with 2 raphes. The leaflets were thick with a lack of central coaptation. The right and left coronary arteries were removed from the aortic root with a button of aortic wall around the ostia. They were retracted carefully out of the way with stay sutures to prevent rotation. The native valve was excised taking care to remove all particulate debri. The annulus was sized and a 27/29 mm On-X Mechanical Valved Graft was chosen. ( Ref # [id number], Serial # [id number]) A series of pledgetted 2-0 Ethibond horizontal mattress sutures were placed around the annulus with the pledgets in a sub-annular position. The sutures were placed through the valve sewing ring. The valve was lowered into place and the sutures at the hinge posts tied first followed by the remaining sutures. The valve seated nicely. The discs moved normally. Small openings were made in the graft for the coronary anastomoses using a thermal cautery. Then the left and right coronary buttons were anastomosed to the graft in an end to side manner using continuous 5-0 prolene suture. A light  coating of CoSeal was applied to each anastomosis for hemostasis. The two grafts were then cut to the appropriate length and anastomosed end to end using continuous 3-0 prolene suture. CoSeal was applied to seal the needle holes in the grafts. A vent cannula was placed into  the graft to remove any air. Deairing maneuvers were performed and the bed placed in trendelenburg position.   Completion:  The patient was rewarmed to 37 degrees Centigrade. A dose of warm KBC reanimation cardioplegia was given. The crossclamp was removed with a time of 153 minutes. There was spontaneous return of sinus rhythm. The position of the grafts was satisfactory. The vascular anastomoses all appeared hemostatic. Two temporary epicardial pacing wires were placed on the right atrium and two on the right ventricle. The patient was weaned from CPB without difficulty on no inotropes. CPB time was 201 minutes. Cardiac output was 5 LPM. TEE showed normal LV systolic function with unchanged moderate LV dilation. RV systolic size and function were normal. Trivial MR unchanged. Heparin was fully reversed with protamine and the venous cannula removed. The aortic sidearm graft was ligated with a heavy silk tie and suture ligated with a 3-0 Prolene pledgetted horizontal mattress suture. Hemostasis was achieved. Mediastinal drainage tubes were placed. The sternum was closed with #6 stainless steel wires. The fascia was closed with continuous # 1 vicryl suture. The subcutaneous tissue was closed with 2-0 vicryl continuous suture. The skin was closed with 3-0 vicryl subcuticular suture. All sponge, needle, and instrument counts were reported correct at the end of the case. Dry sterile dressings were placed over the incisions and around the chest tubes which were connected to pleurevac suction. The patient was then transported to the surgical intensive care unit in stable condition.

## 2022-01-08 NOTE — Transfer of Care (Signed)
Immediate Anesthesia Transfer of Care Note  Patient: Larry Campos.  Procedure(s) Performed: BENTALL PROCEDURE (Chest) TRANSESOPHAGEAL ECHOCARDIOGRAM (TEE)  Patient Location: ICU  Anesthesia Type:General  Level of Consciousness: Patient remains intubated per anesthesia plan  Airway & Oxygen Therapy: Patient placed on Ventilator (see vital sign flow sheet for setting)  Post-op Assessment: Report given to RN and Post -op Vital signs reviewed and stable  Post vital signs: Reviewed and stable  Last Vitals:  Vitals Value Taken Time  BP 92/50 (aline) 01/08/22 1332  Temp 36 C 01/08/22 1332  Pulse 87 01/08/22 1332  Resp 12 01/08/22 1332  SpO2 99 % 01/08/22 1332  Vitals shown include unvalidated device data.  Last Pain:  Vitals:   01/08/22 0558  TempSrc:   PainSc: 0-No pain         Complications: No notable events documented.

## 2022-01-09 ENCOUNTER — Encounter (HOSPITAL_COMMUNITY): Payer: Self-pay | Admitting: Surgery

## 2022-01-09 ENCOUNTER — Inpatient Hospital Stay (HOSPITAL_COMMUNITY): Payer: BC Managed Care – PPO

## 2022-01-09 LAB — CBC
HCT: 28.5 % — ABNORMAL LOW (ref 39.0–52.0)
HCT: 30.9 % — ABNORMAL LOW (ref 39.0–52.0)
Hemoglobin: 10.4 g/dL — ABNORMAL LOW (ref 13.0–17.0)
Hemoglobin: 10.8 g/dL — ABNORMAL LOW (ref 13.0–17.0)
MCH: 32.3 pg (ref 26.0–34.0)
MCH: 31.1 pg (ref 26.0–34.0)
MCHC: 36.5 g/dL — ABNORMAL HIGH (ref 30.0–36.0)
MCHC: 35 g/dL (ref 30.0–36.0)
MCV: 88.5 fL (ref 80.0–100.0)
MCV: 89 fL (ref 80.0–100.0)
Platelets: 77 10*3/uL — ABNORMAL LOW (ref 150–400)
Platelets: 84 10*3/uL — ABNORMAL LOW (ref 150–400)
RBC: 3.22 MIL/uL — ABNORMAL LOW (ref 4.22–5.81)
RBC: 3.47 MIL/uL — ABNORMAL LOW (ref 4.22–5.81)
RDW: 12.4 % (ref 11.5–15.5)
RDW: 12.9 % (ref 11.5–15.5)
WBC: 14.7 10*3/uL — ABNORMAL HIGH (ref 4.0–10.5)
WBC: 15.2 10*3/uL — ABNORMAL HIGH (ref 4.0–10.5)
nRBC: 0 % (ref 0.0–0.2)
nRBC: 0 % (ref 0.0–0.2)

## 2022-01-09 LAB — BPAM CRYOPRECIPITATE
Blood Product Expiration Date: 202311131812
Blood Product Expiration Date: 202311131812
ISSUE DATE / TIME: 202311131231
ISSUE DATE / TIME: 202311131231
Unit Type and Rh: 6200
Unit Type and Rh: 6200

## 2022-01-09 LAB — TYPE AND SCREEN
ABO/RH(D): A NEG
Antibody Screen: NEGATIVE

## 2022-01-09 LAB — BASIC METABOLIC PANEL
Anion gap: 4 — ABNORMAL LOW (ref 5–15)
Anion gap: 6 (ref 5–15)
BUN: 14 mg/dL (ref 6–20)
BUN: 14 mg/dL (ref 6–20)
CO2: 23 mmol/L (ref 22–32)
CO2: 27 mmol/L (ref 22–32)
Calcium: 8.5 mg/dL — ABNORMAL LOW (ref 8.9–10.3)
Calcium: 8.6 mg/dL — ABNORMAL LOW (ref 8.9–10.3)
Chloride: 109 mmol/L (ref 98–111)
Chloride: 103 mmol/L (ref 98–111)
Creatinine, Ser: 0.87 mg/dL (ref 0.61–1.24)
Creatinine, Ser: 0.92 mg/dL (ref 0.61–1.24)
GFR, Estimated: >60 mL/min (ref >60)
GFR, Estimated: >60 mL/min (ref >60)
Glucose, Bld: 134 mg/dL — ABNORMAL HIGH (ref 70–99)
Glucose, Bld: 129 mg/dL — ABNORMAL HIGH (ref 70–99)
Potassium: 4.5 mmol/L (ref 3.5–5.1)
Potassium: 4.3 mmol/L (ref 3.5–5.1)
Sodium: 136 mmol/L (ref 135–145)
Sodium: 136 mmol/L (ref 135–145)

## 2022-01-09 LAB — MAGNESIUM
Magnesium: 2.5 mg/dL — ABNORMAL HIGH (ref 1.7–2.4)
Magnesium: 2.1 mg/dL (ref 1.7–2.4)

## 2022-01-09 LAB — GLUCOSE, CAPILLARY
Glucose-Capillary: 112 mg/dL — ABNORMAL HIGH (ref 70–99)
Glucose-Capillary: 119 mg/dL — ABNORMAL HIGH (ref 70–99)
Glucose-Capillary: 136 mg/dL — ABNORMAL HIGH (ref 70–99)
Glucose-Capillary: 136 mg/dL — ABNORMAL HIGH (ref 70–99)
Glucose-Capillary: 137 mg/dL — ABNORMAL HIGH (ref 70–99)
Glucose-Capillary: 151 mg/dL — ABNORMAL HIGH (ref 70–99)

## 2022-01-09 LAB — PREPARE CRYOPRECIPITATE
Unit division: 0
Unit division: 0

## 2022-01-09 MED ORDER — METOCLOPRAMIDE HCL 5 MG/ML IJ SOLN
10.0000 mg | Freq: Four times a day (QID) | INTRAMUSCULAR | Status: AC
  Administered 2022-01-09 – 2022-01-10 (×4): 10 mg via INTRAVENOUS
  Filled 2022-01-09 (×4): qty 2

## 2022-01-09 MED ORDER — FUROSEMIDE 10 MG/ML IJ SOLN
40.0000 mg | Freq: Once | INTRAMUSCULAR | Status: AC
  Administered 2022-01-09: 40 mg via INTRAVENOUS
  Filled 2022-01-09: qty 4

## 2022-01-09 MED ORDER — ASPIRIN 81 MG PO TBEC
81.0000 mg | DELAYED_RELEASE_TABLET | Freq: Every day | ORAL | Status: DC
  Administered 2022-01-09 – 2022-01-10 (×2): 81 mg via ORAL
  Filled 2022-01-09 (×2): qty 1

## 2022-01-09 MED ORDER — SODIUM CHLORIDE 0.9 % IV SOLN: 6.2500 mg | Freq: Four times a day (QID) | INTRAVENOUS | Status: DC | PRN

## 2022-01-09 MED ORDER — WARFARIN - PHYSICIAN DOSING INPATIENT: Freq: Every day | Status: DC

## 2022-01-09 MED ORDER — WARFARIN SODIUM 1 MG PO TABS
1.0000 mg | ORAL_TABLET | Freq: Once | ORAL | Status: AC
  Administered 2022-01-09: 1 mg via ORAL
  Filled 2022-01-09: qty 1

## 2022-01-09 MED ORDER — ASPIRIN 81 MG PO CHEW: 81.0000 mg | CHEWABLE_TABLET | Freq: Every day | ORAL | Status: DC

## 2022-01-09 NOTE — Progress Notes (Signed)
1 Day Post-Op Procedure(s) (LRB): BENTALL PROCEDURE (N/A) TRANSESOPHAGEAL ECHOCARDIOGRAM (TEE) (N/A) Subjective: No complaints this am. Had some nausea overnight.  Objective: Vital signs in last 24 hours: Temp:  [96.8 F (36 C)-99.3 F (37.4 C)] 98.6 F (37 C) (11/14 0300) Pulse Rate:  [62-109] 90 (11/14 0300) Cardiac Rhythm: Normal sinus rhythm (11/14 0000) Resp:  [4-24] 13 (11/14 0300) BP: (79-128)/(50-78) 94/60 (11/14 0300) SpO2:  [95 %-100 %] 100 % (11/14 0300) Arterial Line BP: (84-138)/(45-84) 116/48 (11/14 0300) FiO2 (%):  [40 %-50 %] 40 % (11/13 1708) Weight:  [65.6 kg] 65.6 kg (11/14 0500)  Hemodynamic parameters for last 24 hours: PAP: (0-23)/(-8-16) 12/3 CO:  [3.1 L/min-7.5 L/min] 6.2 L/min CI:  [1.8 L/min/m2-4.3 L/min/m2] 3.5 L/min/m2  Intake/Output from previous day: 11/13 0701 - 11/14 0700 In: 6353.3 [I.V.:3852.3; Blood:681; IV Piggyback:1820] Out: 4303 [Urine:3010; Emesis/NG output:3; Blood:800; Chest Tube:490] Intake/Output this shift: Total I/O In: 1183 [I.V.:639; IV Piggyback:544] Out: 1153 [Urine:850; Emesis/NG output:3; Chest Tube:300]  General appearance: alert and cooperative Neurologic: intact Heart: regular rate and rhythm and crisp mechanical valve click Lungs: clear to auscultation bilaterally Extremities: edema mild Wound: dressing dry  Lab Results: Recent Labs    01/08/22 1955 01/09/22 0354  WBC 16.6* 14.7*  HGB 11.0* 10.4*  HCT 30.8* 28.5*  PLT 97* 77*   BMET:  Recent Labs    01/08/22 1955 01/09/22 0354  NA 139 136  K 4.6 4.5  CL 111 109  CO2 23 23  GLUCOSE 129* 134*  BUN 14 14  CREATININE 1.17 0.87  CALCIUM 8.1* 8.5*    PT/INR:  Recent Labs    01/08/22 1345  LABPROT 20.7*  INR 1.8*   ABG    Component Value Date/Time   PHART 7.327 (L) 01/08/2022 1844   HCO3 22.3 01/08/2022 1844   TCO2 24 01/08/2022 1844   ACIDBASEDEF 3.0 (H) 01/08/2022 1844   O2SAT 100 01/08/2022 1844   CBG (last 3)  Recent Labs     01/09/22 0137 01/09/22 0306 01/09/22 0359  GLUCAP 136* 119* 137*   CXR: clear  ECG: sinus, LBBB ( new postop)  Assessment/Plan: S/P Procedure(s) (LRB): BENTALL PROCEDURE (N/A) TRANSESOPHAGEAL ECHOCARDIOGRAM (TEE) (N/A)  POD 1 Hemodynamically stable in sinus rhythm. Continue low dose Lopressor. New LBBB postop. Monitor for any higher degree AV block.  Volume excess: wt 7 lbs over preop. Start diuresis.  DC CBG' and insulin.  DC swan, arterial line.  Keep chest tubes in today.  IS, OOB, mobilize.  INR was 1.8 postop. Will give 1 mg Coumadin today. ASA to 81.  Thrombocytopenia: likely due to heparin and pump run. No Lovenox. Follow.   LOS: 1 day    [name] 01/09/2022

## 2022-01-09 NOTE — Plan of Care (Signed)
  Problem: Education: Goal: Understanding of CV disease, CV risk reduction, and recovery process will improve Outcome: Progressing Goal: Individualized Educational Video(s) Outcome: Progressing   Problem: Activity: Goal: Ability to return to baseline activity level will improve Outcome: Progressing   Problem: Cardiovascular: Goal: Ability to achieve and maintain adequate cardiovascular perfusion will improve Outcome: Progressing Goal: Vascular access site(s) Level 0-1 will be maintained Outcome: Progressing   Problem: Education: Goal: Knowledge of General Education information will improve Description: Including pain rating scale, medication(s)/side effects and non-pharmacologic comfort measures Outcome: Progressing   Problem: Health Behavior/Discharge Planning: Goal: Ability to manage health-related needs will improve Outcome: Progressing   Problem: Activity: Goal: Risk for activity intolerance will decrease Outcome: Progressing   Problem: Nutrition: Goal: Adequate nutrition will be maintained Outcome: Progressing   Problem: Pain Managment: Goal: General experience of comfort will improve Outcome: Progressing   Problem: Safety: Goal: Ability to remain free from injury will improve Outcome: Progressing   Problem: Education: Goal: Knowledge of disease or condition will improve Outcome: Progressing   Problem: Clinical Measurements: Goal: Postoperative complications will be avoided or minimized Outcome: Progressing   Problem: Urinary Elimination: Goal: Ability to achieve and maintain adequate renal perfusion and functioning will improve Outcome: Progressing

## 2022-01-09 NOTE — Progress Notes (Signed)
TCTS Progress Note: POD 1 [place]all Most lines came out today Cts still in, dry Coumadin ordered Patient comfortable at bedside     Latest Ref Rng & Units 01/09/2022    4:51 PM 01/09/2022    3:54 AM 01/08/2022    7:55 PM  CBC  WBC 4.0 - 10.5 K/uL 15.2  14.7  16.6   Hemoglobin 13.0 - 17.0 g/dL 16.1  09.6  04.5   Hematocrit 39.0 - 52.0 % 30.9  28.5  30.8   Platelets 150 - 400 K/uL 84  77  97        Latest Ref Rng & Units 01/09/2022    4:51 PM 01/09/2022    3:54 AM 01/08/2022    7:55 PM  CMP  Glucose 70 - 99 mg/dL 409  811  914   BUN 6 - 20 mg/dL 14  14  14    Creatinine 0.61 - 1.24 mg/dL  7.82  9.56   Sodium 135 - 145 mmol/L 136  136  139   Potassium 3.5 - 5.1 mmol/L 4.3  4.5  4.6   Chloride 98 - 111 mmol/L 103  109  111   CO2 22 - 32 mmol/L 27  23  23    Calcium 8.9 - 10.3 mg/dL 8.6  8.5  8.1     ABG    Component Value Date/Time   PHART 7.327 (L) 01/08/2022 1844   PCO2ART 42.7 01/08/2022 1844   PO2ART 218 (H) 01/08/2022 1844   HCO3 22.3 01/08/2022 1844   TCO2 24 01/08/2022 1844   ACIDBASEDEF 3.0 (H) 01/08/2022 1844   O2SAT 100 01/08/2022 1844

## 2022-01-10 ENCOUNTER — Inpatient Hospital Stay (HOSPITAL_COMMUNITY): Payer: BC Managed Care – PPO

## 2022-01-10 ENCOUNTER — Encounter (HOSPITAL_COMMUNITY): Payer: Self-pay | Admitting: Surgery

## 2022-01-10 LAB — PROTIME-INR
INR: 1.5 — ABNORMAL HIGH (ref 0.8–1.2)
Prothrombin Time: 17.9 seconds — ABNORMAL HIGH (ref 11.4–15.2)

## 2022-01-10 LAB — CBC
HCT: 32.6 % — ABNORMAL LOW (ref 39.0–52.0)
Hemoglobin: 11.2 g/dL — ABNORMAL LOW (ref 13.0–17.0)
MCH: 31.2 pg (ref 26.0–34.0)
MCHC: 34.4 g/dL (ref 30.0–36.0)
MCV: 90.8 fL (ref 80.0–100.0)
Platelets: 90 10*3/uL — ABNORMAL LOW (ref 150–400)
RBC: 3.59 MIL/uL — ABNORMAL LOW (ref 4.22–5.81)
RDW: 12.7 % (ref 11.5–15.5)
WBC: 18.5 10*3/uL — ABNORMAL HIGH (ref 4.0–10.5)
nRBC: 0 % (ref 0.0–0.2)

## 2022-01-10 LAB — BASIC METABOLIC PANEL
Anion gap: 8 (ref 5–15)
BUN: 16 mg/dL (ref 6–20)
CO2: 25 mmol/L (ref 22–32)
Calcium: 8.8 mg/dL — ABNORMAL LOW (ref 8.9–10.3)
Chloride: 101 mmol/L (ref 98–111)
Creatinine, Ser: 1.01 mg/dL (ref 0.61–1.24)
GFR, Estimated: >60 mL/min (ref >60)
Glucose, Bld: 144 mg/dL — ABNORMAL HIGH (ref 70–99)
Potassium: 4.2 mmol/L (ref 3.5–5.1)
Sodium: 134 mmol/L — ABNORMAL LOW (ref 135–145)

## 2022-01-10 LAB — SURGICAL PATHOLOGY

## 2022-01-10 MED ORDER — FUROSEMIDE 10 MG/ML IJ SOLN
40.0000 mg | Freq: Once | INTRAMUSCULAR | Status: AC
  Administered 2022-01-10: 40 mg via INTRAVENOUS
  Filled 2022-01-10: qty 4

## 2022-01-10 MED ORDER — SODIUM CHLORIDE 0.9 % IV SOLN: 250.0000 mL | INTRAVENOUS | Status: DC | PRN

## 2022-01-10 MED ORDER — SODIUM CHLORIDE 0.9% FLUSH
3.0000 mL | Freq: Two times a day (BID) | INTRAVENOUS | Status: DC
  Administered 2022-01-10 – 2022-01-13 (×4): 3 mL via INTRAVENOUS

## 2022-01-10 MED ORDER — WARFARIN SODIUM 2.5 MG PO TABS
2.5000 mg | ORAL_TABLET | Freq: Every day | ORAL | Status: DC
  Administered 2022-01-10 – 2022-01-13 (×4): 2.5 mg via ORAL
  Filled 2022-01-10 (×4): qty 1

## 2022-01-10 MED ORDER — METOPROLOL TARTRATE 12.5 MG HALF TABLET
12.5000 mg | ORAL_TABLET | Freq: Two times a day (BID) | ORAL | Status: DC
  Administered 2022-01-10 – 2022-01-12 (×4): 12.5 mg via ORAL
  Filled 2022-01-10 (×4): qty 1

## 2022-01-10 MED ORDER — SODIUM CHLORIDE 0.9% FLUSH: 3.0000 mL | INTRAVENOUS | Status: DC | PRN

## 2022-01-10 MED ORDER — ASPIRIN 81 MG PO TBEC
81.0000 mg | DELAYED_RELEASE_TABLET | Freq: Every day | ORAL | Status: DC
  Administered 2022-01-11 – 2022-01-14 (×4): 81 mg via ORAL
  Filled 2022-01-10 (×5): qty 1

## 2022-01-10 MED ORDER — POTASSIUM CHLORIDE CRYS ER 20 MEQ PO TBCR
20.0000 meq | EXTENDED_RELEASE_TABLET | Freq: Two times a day (BID) | ORAL | Status: AC
  Administered 2022-01-10 (×2): 20 meq via ORAL
  Filled 2022-01-10 (×2): qty 1

## 2022-01-10 MED ORDER — SENNOSIDES-DOCUSATE SODIUM 8.6-50 MG PO TABS: 1.0000 | ORAL_TABLET | Freq: Two times a day (BID) | ORAL | Status: DC | PRN

## 2022-01-10 MED ORDER — ~~LOC~~ CARDIAC SURGERY, PATIENT & FAMILY EDUCATION: Freq: Once | Status: AC

## 2022-01-10 NOTE — Progress Notes (Signed)
2 Days Post-Op Procedure(s) (LRB): BENTALL PROCEDURE (N/A) TRANSESOPHAGEAL ECHOCARDIOGRAM (TEE) (N/A) Subjective: No complaints. Slept some overnight. Ambulated this am.  Objective: Vital signs in last 24 hours: Temp:  [98.8 F (37.1 C)-99.2 F (37.3 C)] 99.2 F (37.3 C) (11/15 0600) Pulse Rate:  [90-129] 112 (11/15 5916) Cardiac Rhythm: Normal sinus rhythm;Sinus tachycardia (11/15 0500) Resp:  [12-33] 31 (11/15 0608) BP: (83-130)/(59-87) 130/87 (11/15 0600) SpO2:  [94 %-100 %] 95 % (11/15 3846) Weight:  [63.3 kg] 63.3 kg (11/15 0500)  Hemodynamic parameters for last 24 hours:    Intake/Output from previous day: 11/14 0701 - 11/15 0700 In: 1415.8 [I.V.:1173.9; IV Piggyback:241.8] Out: 2080 [Urine:1810; Chest Tube:270] Intake/Output this shift: No intake/output data recorded.  General appearance: alert and cooperative Neurologic: intact Heart: regular rate and rhythm and crisp mechanical valve click Lungs: clear to auscultation bilaterally Extremities: edema mild Wound: dressing dry  Lab Results: Recent Labs    01/09/22 1651 01/10/22 0615  WBC 15.2* 18.5*  HGB 10.8* 11.2*  HCT 30.9* 32.6*  PLT 84* 90*   BMET:  Recent Labs    01/09/22 1651 01/10/22 0615  NA 136 134*  K 4.3 4.2  CL 103 101  CO2 27 25  GLUCOSE 129* 144*  BUN 14 16  CREATININE 0.92 1.01  CALCIUM 8.6* 8.8*    PT/INR:  Recent Labs    01/10/22 0615  LABPROT 17.9*  INR 1.5*   ABG    Component Value Date/Time   PHART 7.327 (L) 01/08/2022 1844   HCO3 22.3 01/08/2022 1844   TCO2 24 01/08/2022 1844   ACIDBASEDEF 3.0 (H) 01/08/2022 1844   O2SAT 100 01/08/2022 1844   CBG (last 3)  Recent Labs    01/09/22 0359 01/09/22 0626 01/09/22 0819  GLUCAP 137* 112* 136*   CXR: mild left base atelectasis  Assessment/Plan: S/P Procedure(s) (LRB): BENTALL PROCEDURE (N/A) TRANSESOPHAGEAL ECHOCARDIOGRAM (TEE) (N/A)  POD 1 Hemodynamically stable in sinus rhythm. Mild resting tachycardia.  Lopressor held yesterday due to low normal SBP. Will continue low dose.  Volume excess: wt down 5 lbs. Now 2.5 over preop. Will give another dose of lasix this am.  DC pacing wires this am and then chest tubes an hour later.  DC sleeve and foley.  INR 1.5. Coumadin 2.5 daily and adjust as needed. Goal INR 2.5.  Transfer to 4E and continue mobilization.   LOS: 2 days    Larry Campos 01/10/2022

## 2022-01-10 NOTE — Progress Notes (Incomplete)
Chest tube present

## 2022-01-10 NOTE — Progress Notes (Signed)
Pt Foley d/c per order at 0800. 40mg  IV lasix given per MAR. 0930 Pt c/o needing to urinate. Tried x3 without success. Bladder scan showed >999. Pt I&O cathed per protocol at 1000. 04-18-2003 UOP.

## 2022-01-11 LAB — PROTIME-INR
INR: 1.6 — ABNORMAL HIGH (ref 0.8–1.2)
Prothrombin Time: 19.2 seconds — ABNORMAL HIGH (ref 11.4–15.2)

## 2022-01-11 NOTE — Plan of Care (Signed)
°  Problem: Clinical Measurements: °Goal: Respiratory complications will improve °Outcome: Progressing °Goal: Cardiovascular complication will be avoided °Outcome: Progressing °  °Problem: Activity: °Goal: Risk for activity intolerance will decrease °Outcome: Progressing °  °

## 2022-01-11 NOTE — Progress Notes (Signed)
Pt walked 2 laps with NT, RPE 8. Pt eating breakfast, left pt with materials about exercise and diet. Will ambulate and educate in afternoon.  Faustino Congress, MS EP 01/11/2022 10:45 AM

## 2022-01-11 NOTE — Progress Notes (Addendum)
CARDIAC REHAB PHASE I   PRE:  Rate/Rhythm: 107 ST  BP:  Sitting: 117/71      SaO2: 97 RA  MODE:  Ambulation: 940 ft   POST:  Rate/Rhythm: 114 ST  BP:  Sitting: 115/76      SaO2: 97 RA  Pt ambulated 764ft with RW and 213ft with contact guard assistance w/o AD. Pt tolerated walk well w/o complaints, RPE 7. Pt received heart healthy, exercise guidelines, Move in the Tube sheet, and OHS book. Pt was encouraged to look at d/c restrictions and continue using IS at least 10x/hr. Pt will be referred to [facility].   [phone]  Larry Campos  1:44 PM 01/11/2022

## 2022-01-11 NOTE — Progress Notes (Addendum)
      [address]       [place] [address]             [phone]      3 Days Post-Op Procedure(s) (LRB): BENTALL PROCEDURE (N/A) TRANSESOPHAGEAL ECHOCARDIOGRAM (TEE) (N/A)  Subjective:  Patient doing okay.  He some N/V post operative this has since resolved. He has not ambulated since moving to this unit.  Objective: Vital signs in last 24 hours: Temp:  [97.5 F (36.4 C)-99.1 F (37.3 C)] 98.9 F (37.2 C) (11/16 0407) Pulse Rate:  [90-111] 99 (11/16 0407) Cardiac Rhythm: Sinus tachycardia (11/16 0749) Resp:  [14-27] 20 (11/16 0407) BP: (94-124)/(61-82) 116/67 (11/16 0407) SpO2:  [96 %-98 %] 97 % (11/16 0407) Weight:  [60.4 kg] 60.4 kg (11/16 0614)  Intake/Output from previous day: 11/15 0701 - 11/16 0700 In: 180 [I.V.:80; IV Piggyback:100] Out: 930 [Urine:900; Chest Tube:30]  General appearance: alert, cooperative, and no distress Heart: regular rate and rhythm and tachy Lungs: clear to auscultation bilaterally Abdomen: soft, non-tender; bowel sounds normal; no masses,  no organomegaly Extremities: edema trace Wound: clean and dry  Lab Results: Recent Labs    01/09/22 1651 01/10/22 0615  WBC 15.2* 18.5*  HGB 10.8* 11.2*  HCT 30.9* 32.6*  PLT 84* 90*   BMET:  Recent Labs    01/09/22 1651 01/10/22 0615  NA 136 134*  K 4.3 4.2  CL 103 101  CO2 27 25  GLUCOSE 129* 144*  BUN 14 16  CREATININE 0.92 1.01  CALCIUM 8.6* 8.8*    PT/INR:  Recent Labs    01/11/22 0543  LABPROT 19.2*  INR 1.6*   ABG    Component Value Date/Time   PHART 7.327 (L) 01/08/2022 1844   HCO3 22.3 01/08/2022 1844   TCO2 24 01/08/2022 1844   ACIDBASEDEF 3.0 (H) 01/08/2022 1844   O2SAT 100 01/08/2022 1844   CBG (last 3)  Recent Labs    01/09/22 0359 01/09/22 0626 01/09/22 0819  GLUCAP 137* 112* 136*    Assessment/Plan: S/P Procedure(s) (LRB): BENTALL PROCEDURE (N/A) TRANSESOPHAGEAL ECHOCARDIOGRAM (TEE) (N/A)  CV- Sinus Tachycardia- BP is  prohibitive of increase BB.. will continue Lopressor at 12.5 mg BID INR 1.6- continue coumadin at 2.5 mg daily.Marland Kitchen if no bump tomorrow, will increase to 5 mg daily Pulm- no acute issues, off oxygen Renal-creatinine stable at 1.01, weight is below baseline, off Lasix, potassium Dispo- patient stable, continue coumadin for mechanical valve.. if INR doesn't bump will increase coumadin tomorrow, needs to ambulate.   LOS: 3 days    Lowella Dandy, PA-C 01/11/2022   Chart reviewed, patient examined, agree with above. He is doing well POD 3. INR 1.6. Plan 2.5 mg Coumadin today and if not significant bump increase to 5 tomorrow. Home when INR close to 2.

## 2022-01-12 ENCOUNTER — Encounter (HOSPITAL_COMMUNITY): Payer: Self-pay | Admitting: Surgery

## 2022-01-12 ENCOUNTER — Other Ambulatory Visit: Payer: Self-pay | Admitting: Cardiology

## 2022-01-12 DIAGNOSIS — Z9889 Other specified postprocedural states: Secondary | ICD-10-CM

## 2022-01-12 LAB — PROTIME-INR
INR: 1.9 — ABNORMAL HIGH (ref 0.8–1.2)
Prothrombin Time: 21.2 seconds — ABNORMAL HIGH (ref 11.4–15.2)

## 2022-01-12 MED ORDER — METOPROLOL TARTRATE 25 MG PO TABS
25.0000 mg | ORAL_TABLET | Freq: Two times a day (BID) | ORAL | Status: DC
  Administered 2022-01-12 – 2022-01-14 (×4): 25 mg via ORAL
  Filled 2022-01-12 (×4): qty 1

## 2022-01-12 NOTE — Progress Notes (Signed)
CARDIAC REHAB PHASE I   PRE:  Rate/Rhythm: 114 ST  BP:  Sitting: 109/72      SaO2: 93 RA  MODE:  Ambulation:  940 ft   POST:  Rate/Rhythm: 130 st  BP:  Sitting: 128/75      SaO2: 93 RA  Pt ambulated 233ft with RW and 717ft with standby assistance w/o AD. Pt tolerated walk well w/o complaints, RPE 8. Pt educated on restrictions and encouraged continued IS use after when d/c.   Larry Campos  8:51 AM 01/12/2022

## 2022-01-12 NOTE — Progress Notes (Addendum)
      [address]       [place] [address]             [phone]      4 Days Post-Op Procedure(s) (LRB): BENTALL PROCEDURE (N/A) TRANSESOPHAGEAL ECHOCARDIOGRAM (TEE) (N/A) Subjective: Pt states he woke up this morning with the worst chest pain he has had so far but it has increased 10 fold since receiving pain medication.  Objective: Vital signs in last 24 hours: Temp:  [98.6 F (37 C)-99.4 F (37.4 C)] 98.6 F (37 C) (11/17 0742) Pulse Rate:  [92-111] 111 (11/17 0742) Cardiac Rhythm: Sinus tachycardia (11/16 2010) Resp:  [17-24] 24 (11/17 0742) BP: (101-117)/(68-73) 109/72 (11/17 0742) SpO2:  [95 %-98 %] 95 % (11/17 0742) Weight:  [60.2 kg] 60.2 kg (11/17 0500)  Hemodynamic parameters for last 24 hours:    Intake/Output from previous day: 11/16 0701 - 11/17 0700 In: 240 [P.O.:240] Out: -  Intake/Output this shift: No intake/output data recorded.  General appearance: alert, cooperative, and no distress Neurologic: intact Heart: Sinus tachycardia, no murmur, rub or gallop Lungs: Diminished breath sounds left bibasilar otherwise clear to auscultation Abdomen: soft, non-tender; bowel sounds normal; no masses,  no organomegaly Extremities: extremities normal, atraumatic, no cyanosis or edema Wound: Clean, dry, intact  Lab Results: Recent Labs    01/09/22 1651 01/10/22 0615  WBC 15.2* 18.5*  HGB 10.8* 11.2*  HCT 30.9* 32.6*  PLT 84* 90*   BMET:  Recent Labs    01/09/22 1651 01/10/22 0615  NA 136 134*  K 4.3 4.2  CL 103 101  CO2 27 25  GLUCOSE 129* 144*  BUN 14 16  CREATININE 0.92 1.01  CALCIUM 8.6* 8.8*    PT/INR:  Recent Labs    01/12/22 0126  LABPROT 21.2*  INR 1.9*   ABG    Component Value Date/Time   PHART 7.327 (L) 01/08/2022 1844   HCO3 22.3 01/08/2022 1844   TCO2 24 01/08/2022 1844   ACIDBASEDEF 3.0 (H) 01/08/2022 1844   O2SAT 100 01/08/2022 1844   CBG (last 3)  Recent Labs    01/09/22 0819  GLUCAP 136*     Assessment/Plan: S/P Procedure(s) (LRB): BENTALL PROCEDURE (N/A) TRANSESOPHAGEAL ECHOCARDIOGRAM (TEE) (N/A)  CV: Sinus tachycardia, rate 110-120's this AM. SBP 109. Discussed with surgeon, will increase lopressor 25mg  BID.   Pulm: Saturating 95% on RA, no shortness of breath. Excellent ambulation yesterday. Diminished left basilar breath sounds, CXR 11/15 showed left basilar atelectasis. Encourage IS and ambulation.  GI: +BM, good appetite  Renal: Under preop weight. Diuresis discontinued.  Coumadin: INR 1.9 today, on 2.5mg  of coumadin. Will continue 2.5mg  coumadin.   Dispo: Continue coumadin for mechanical valve. INR goal 2.5. Hopefully d/c home tomorrow if stable rhythm and INR toward goal.    LOS: 4 days    12/15, PA-C 01/12/2022   Chart reviewed, patient examined, agree with above. He has resting sinus tach but not unusual for a 26 year old. Will increase Lopressor to 25 bid and see how he tolerates that. His BP runs low normal so it may be too much. INR up to 1.9. Continue Coumadin 2.5. Possibly home tomorrow if no changes and we can decide on Coumadin dose.

## 2022-01-13 LAB — CBC
HCT: 27.5 % — ABNORMAL LOW (ref 39.0–52.0)
Hemoglobin: 9.5 g/dL — ABNORMAL LOW (ref 13.0–17.0)
MCH: 30.7 pg (ref 26.0–34.0)
MCHC: 34.5 g/dL (ref 30.0–36.0)
MCV: 89 fL (ref 80.0–100.0)
Platelets: 179 10*3/uL (ref 150–400)
RBC: 3.09 MIL/uL — ABNORMAL LOW (ref 4.22–5.81)
RDW: 12.5 % (ref 11.5–15.5)
WBC: 8.4 10*3/uL (ref 4.0–10.5)
nRBC: 0 % (ref 0.0–0.2)

## 2022-01-13 LAB — BASIC METABOLIC PANEL
Anion gap: 11 (ref 5–15)
BUN: 17 mg/dL (ref 6–20)
CO2: 27 mmol/L (ref 22–32)
Calcium: 8.6 mg/dL — ABNORMAL LOW (ref 8.9–10.3)
Chloride: 100 mmol/L (ref 98–111)
Creatinine, Ser: 0.88 mg/dL (ref 0.61–1.24)
GFR, Estimated: >60 mL/min (ref >60)
Glucose, Bld: 109 mg/dL — ABNORMAL HIGH (ref 70–99)
Potassium: 3.6 mmol/L (ref 3.5–5.1)
Sodium: 138 mmol/L (ref 135–145)

## 2022-01-13 LAB — PROTIME-INR
INR: 2.3 — ABNORMAL HIGH (ref 0.8–1.2)
Prothrombin Time: 25.4 seconds — ABNORMAL HIGH (ref 11.4–15.2)

## 2022-01-13 MED ORDER — POTASSIUM CHLORIDE CRYS ER 20 MEQ PO TBCR
40.0000 meq | EXTENDED_RELEASE_TABLET | Freq: Once | ORAL | Status: AC
  Administered 2022-01-13: 40 meq via ORAL
  Filled 2022-01-13: qty 2

## 2022-01-13 NOTE — Progress Notes (Addendum)
      [address]       [name] [address]             [phone]        5 Days Post-Op Procedure(s) (LRB): BENTALL PROCEDURE (N/A) TRANSESOPHAGEAL ECHOCARDIOGRAM (TEE) (N/A)  Subjective: Patient just waking up this am. He has no complaint this am. He hopes to go home.  Objective: Vital signs in last 24 hours: Temp:  [97.4 F (36.3 C)-98.6 F (37 C)] 98.5 F (36.9 C) (11/18 0452) Pulse Rate:  [83-111] 83 (11/18 0452) Cardiac Rhythm: Normal sinus rhythm (11/17 1958) Resp:  [13-24] 20 (11/18 0452) BP: (103-111)/(64-72) 111/69 (11/18 0452) SpO2:  [95 %-97 %] 96 % (11/18 0452) Weight:  [60 kg] 60 kg (11/18 0452)  Pre op weight 62.1 kg Current Weight  01/13/22 60 kg      Intake/Output from previous day: No intake/output data recorded.  Physical Exam: Cardiovascular: RRR Pulmonary: Clear to auscultation bilaterally Abdomen: Soft, non tender, bowel sounds present. Extremities: No  lower extremity edema. Wound: Clean and dry.  No erythema or signs of infection.  Lab Results: CBC: Recent Labs    01/13/22 0054  WBC 8.4  HGB 9.5*  HCT 27.5*  PLT 179   BMET:  Recent Labs    01/13/22 0054  NA 138  K 3.6  CL 100  CO2 27  GLUCOSE 109*  BUN 17  CREATININE 0.88  CALCIUM 8.6*    PT/INR:  Lab Results  Component Value Date   INR 2.3 (H) 01/13/2022   INR 1.9 (H) 01/12/2022   INR 1.6 (H) 01/11/2022   ABG:  INR: Will add last result for INR, ABG once components are confirmed Will add last 4 CBG results once components are confirmed  Assessment/Plan:  1. CV - SR. INR this am 2.3. On Lopressor 25 mg bid and Coumadin. 2.  Pulmonary - On room air. Encourage incentive spirometer. 3. Supplement potassium 4.  Expected post op acute blood loss anemia - H and H this am decreased to 9.5 and 27.5.  5. Thrombocytopenia resolved-platelets this am 179,000 6. Will discuss disposition with surgeon as INR close to 2.5  Donielle M  ZimmermanPA-C 7:16 AM   Agree with above Watching INR Home tomorrow  Corliss Skains

## 2022-01-13 NOTE — Progress Notes (Signed)
CARDIAC REHAB PHASE I   PRE:  Rate/Rhythm: 101 ST    BP: sitting     SaO2:   MODE:  Ambulation: 940 ft   POST:  Rate/Rhythm: 123 ST    BP: sitting 102/68     SaO2: 98 RA  Pt coming out of his room with wife. Ambulated independently, no assist needed. No c/o, HR lower today. To recliner with VSS. Practiced IS, 1100 ml. Reviewed restrictions, walking, CRPII, showering. They have questions regarding vitamins for d/c.  [phone]   Ethelda Chick BS, ACSM-CEP 01/13/2022 9:22 AM

## 2022-01-14 LAB — PROTIME-INR
INR: 2.4 — ABNORMAL HIGH (ref 0.8–1.2)
Prothrombin Time: 26.1 seconds — ABNORMAL HIGH (ref 11.4–15.2)

## 2022-01-14 MED ORDER — WARFARIN SODIUM 2.5 MG PO TABS: 2.5000 mg | ORAL_TABLET | Freq: Every day | ORAL | 1 refills | Status: DC

## 2022-01-14 MED ORDER — ASPIRIN 81 MG PO TBEC: 81.0000 mg | DELAYED_RELEASE_TABLET | Freq: Every day | ORAL | 12 refills | Status: AC

## 2022-01-14 MED ORDER — METOPROLOL TARTRATE 25 MG PO TABS: 25.0000 mg | ORAL_TABLET | Freq: Two times a day (BID) | ORAL | 1 refills | Status: DC

## 2022-01-14 MED ORDER — WARFARIN SODIUM 5 MG PO TABS
5.0000 mg | ORAL_TABLET | Freq: Once | ORAL | Status: AC
  Administered 2022-01-14: 5 mg via ORAL
  Filled 2022-01-14: qty 1

## 2022-01-14 MED ORDER — WARFARIN SODIUM 5 MG PO TABS: 5.0000 mg | ORAL_TABLET | Freq: Once | ORAL | Status: DC

## 2022-01-14 MED ORDER — OXYCODONE HCL 5 MG PO TABS: 5.0000 mg | ORAL_TABLET | Freq: Four times a day (QID) | ORAL | 0 refills | Status: DC | PRN

## 2022-01-14 NOTE — Progress Notes (Signed)
      [address]       [name] [address]             [phone]        6 Days Post-Op Procedure(s) (LRB): BENTALL PROCEDURE (N/A) TRANSESOPHAGEAL ECHOCARDIOGRAM (TEE) (N/A)  Subjective: Patient has no complaint this am. He hopes to go home.  Objective: Vital signs in last 24 hours: Temp:  [98.1 F (36.7 C)-99.2 F (37.3 C)] 99.2 F (37.3 C) (11/19 0425) Pulse Rate:  [81-99] 99 (11/19 0425) Cardiac Rhythm: Normal sinus rhythm (11/18 1917) Resp:  [14-20] 18 (11/19 0425) BP: (103-116)/(63-73) 111/73 (11/19 0425) SpO2:  [97 %-100 %] 100 % (11/19 0425) Weight:  [60 kg] 60 kg (11/19 0425)  Pre op weight 62.1 kg Current Weight  01/14/22 60 kg      Intake/Output from previous day: No intake/output data recorded.  Physical Exam: Cardiovascular: RRR, sharp valve clikc Pulmonary: Clear to auscultation bilaterally Abdomen: Soft, non tender, bowel sounds present. Extremities: No lower extremity edema. Wound: Clean and dry.  No erythema or signs of infection.  Lab Results: CBC: Recent Labs    01/13/22 0054  WBC 8.4  HGB 9.5*  HCT 27.5*  PLT 179    BMET:  Recent Labs    01/13/22 0054  NA 138  K 3.6  CL 100  CO2 27  GLUCOSE 109*  BUN 17  CREATININE 0.88  CALCIUM 8.6*     PT/INR:  Lab Results  Component Value Date   INR 2.4 (H) 01/14/2022   INR 2.3 (H) 01/13/2022   INR 1.9 (H) 01/12/2022   ABG:  INR: Will add last result for INR, ABG once components are confirmed Will add last 4 CBG results once components are confirmed  Assessment/Plan:  1. CV - SR, ST at times (has been post op). INR this am 2.4. On Lopressor 25 mg bid and Coumadin. Will give 5 mg of Coumadin tonight. He likely will need a combination of 2.5 mg and 5 mg. 2.  Pulmonary - On room air. Encourage incentive spirometer. 3.  Expected post op acute blood loss anemia - Last H and H  decreased to 9.5 and 27.5.  4. As discussed with Dr. Cliffton Asters, will  discharge  Janney Priego M ZimmermanPA-C 7:30 AM

## 2022-01-15 ENCOUNTER — Ambulatory Visit: Payer: BC Managed Care – PPO | Attending: Internal Medicine | Admitting: *Deleted

## 2022-01-15 DIAGNOSIS — Z5181 Encounter for therapeutic drug level monitoring: Secondary | ICD-10-CM | POA: Diagnosis not present

## 2022-01-15 DIAGNOSIS — Z952 Presence of prosthetic heart valve: Secondary | ICD-10-CM

## 2022-01-15 LAB — POCT INR: INR: 3.9 — AB (ref 2.0–3.0)

## 2022-01-15 NOTE — Patient Instructions (Signed)
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 (04/17/22) Hold warfarin tonight then decrease dose to 1 tablet daily except 1/2 tablet on Tuesdays and Saturdays. Recheck in 1 wk

## 2022-01-16 MED FILL — Heparin Sodium (Porcine) Inj 1000 Unit/ML: Qty: 1000 | Status: AC

## 2022-01-16 MED FILL — Sodium Chloride IV Soln 0.9%: INTRAVENOUS | Qty: 3000 | Status: AC

## 2022-01-16 MED FILL — Electrolyte-R (PH 7.4) Solution: INTRAVENOUS | Qty: 5000 | Status: AC

## 2022-01-16 MED FILL — Mannitol IV Soln 20%: INTRAVENOUS | Qty: 500 | Status: AC

## 2022-01-16 MED FILL — Heparin Sodium (Porcine) Inj 1000 Unit/ML: INTRAMUSCULAR | Qty: 10 | Status: AC

## 2022-01-16 MED FILL — Potassium Chloride Inj 2 mEq/ML: INTRAVENOUS | Qty: 40 | Status: AC

## 2022-01-16 MED FILL — Lidocaine HCl Local Preservative Free (PF) Inj 2%: INTRAMUSCULAR | Qty: 14 | Status: AC

## 2022-01-16 MED FILL — Sodium Bicarbonate IV Soln 8.4%: INTRAVENOUS | Qty: 50 | Status: AC

## 2022-01-16 MED FILL — Albumin, Human Inj 5%: INTRAVENOUS | Qty: 250 | Status: AC

## 2022-01-22 ENCOUNTER — Ambulatory Visit: Payer: BC Managed Care – PPO | Attending: Cardiology | Admitting: *Deleted

## 2022-01-22 DIAGNOSIS — Z952 Presence of prosthetic heart valve: Secondary | ICD-10-CM | POA: Diagnosis not present

## 2022-01-22 DIAGNOSIS — Z4802 Encounter for removal of sutures: Secondary | ICD-10-CM

## 2022-01-22 DIAGNOSIS — Z5181 Encounter for therapeutic drug level monitoring: Secondary | ICD-10-CM

## 2022-01-22 LAB — POCT INR: INR: 1.8 — AB (ref 2.0–3.0)

## 2022-01-22 NOTE — Patient Instructions (Signed)
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 (04/17/22) Increase warfarin to 1 tablet daily except 1/2 tablet on Saturdays. Recheck in 1 wk

## 2022-01-29 ENCOUNTER — Ambulatory Visit (INDEPENDENT_AMBULATORY_CARE_PROVIDER_SITE_OTHER): Payer: BC Managed Care – PPO | Admitting: *Deleted

## 2022-01-29 ENCOUNTER — Encounter: Payer: Self-pay | Admitting: Nurse Practitioner

## 2022-01-29 ENCOUNTER — Ambulatory Visit: Payer: BC Managed Care – PPO | Attending: Nurse Practitioner | Admitting: Nurse Practitioner

## 2022-01-29 VITALS — BP 114/69 | HR 76 | Ht 68.0 in | Wt 131.0 lb

## 2022-01-29 DIAGNOSIS — I7121 Aneurysm of the ascending aorta, without rupture: Secondary | ICD-10-CM

## 2022-01-29 DIAGNOSIS — Z952 Presence of prosthetic heart valve: Secondary | ICD-10-CM | POA: Diagnosis not present

## 2022-01-29 DIAGNOSIS — Z7901 Long term (current) use of anticoagulants: Secondary | ICD-10-CM | POA: Diagnosis not present

## 2022-01-29 DIAGNOSIS — I350 Nonrheumatic aortic (valve) stenosis: Secondary | ICD-10-CM | POA: Diagnosis not present

## 2022-01-29 DIAGNOSIS — Z9889 Other specified postprocedural states: Secondary | ICD-10-CM

## 2022-01-29 DIAGNOSIS — D649 Anemia, unspecified: Secondary | ICD-10-CM | POA: Diagnosis not present

## 2022-01-29 DIAGNOSIS — Q230 Congenital stenosis of aortic valve: Secondary | ICD-10-CM | POA: Diagnosis not present

## 2022-01-29 DIAGNOSIS — Q231 Congenital insufficiency of aortic valve: Secondary | ICD-10-CM

## 2022-01-29 DIAGNOSIS — Z5181 Encounter for therapeutic drug level monitoring: Secondary | ICD-10-CM

## 2022-01-29 DIAGNOSIS — D6489 Other specified anemias: Secondary | ICD-10-CM | POA: Diagnosis not present

## 2022-01-29 DIAGNOSIS — Z8679 Personal history of other diseases of the circulatory system: Secondary | ICD-10-CM

## 2022-01-29 LAB — POCT INR: INR: 2 (ref 2.0–3.0)

## 2022-01-29 NOTE — Patient Instructions (Signed)
Medication Instructions:  Your physician recommends that you continue on your current medications as directed. Please refer to the Current Medication list given to you today.   Labwork: CBC  Testing/Procedures: None today  Follow-Up: 3 months Dr.McDowell  Any Other Special Instructions Will Be Listed Below (If Applicable).  If you need a refill on your cardiac medications before your next appointment, please call your pharmacy.

## 2022-01-29 NOTE — Progress Notes (Signed)
Cardiology Office Note:    Date:  01/29/2022   ID:  Larry Brash., DOB June 21, 1995, MRN [id number]  PCP:  Practice, Larry Campos   [facility] Providers Cardiologist:  Larry Dell, MD     Referring MD: Larry Campos*   CC: Here for follow-up post AVR and ascending aortic aneurysm repair  History of Present Illness:    Larry Lor. is a 26 y.o. 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  He was originally diagnosed with congenital abnormal aortic valve that was discovered at [facility].  TTE revealed EF 55% in 2021, with moderate aortic stenosis and severe aortic regurgitation, possible unicuspid aortic valve.  Repeat TTE was done in 2022 and revealed moderate aortic regurgitation, moderate aortic stenosis with mean gradient at 22 mmHg.  Mild dilatation of ascending aorta was also noted at 38 mm.  Repeat echocardiogram in March 2023 revealed EF 55 to 60%, unicuspid/bicuspid aortic valve with AR being moderate to severe, moderate aortic stenosis with mean gradient measuring 26.8 mmHg.  Borderline dilatation of aortic root, measuring 39 mm.  Was evaluated at Larry Campos, ED in May 2023 for a near syncopal event.  He described palpitations with feeling of room spinning, also noted left-sided chest pain that was sharp, nonradiating, episode lasted 5 minutes.  Episode was relieved with sitting down and resting.  Workup in ED revealed troponins that were flat, negative D-dimer, EKG revealed sinus arrhythmia, lab work overall unremarkable.  14-day ZIO monitor was arranged outpatient for him and revealed sinus rhythm, no significant arrhythmias or pauses noted.  Underwent cardiac MRI in July 2023 that revealed mild to moderate LV dilation with EF  55%, mildly dilated aortic root and ascending aorta, bicuspid aortic valve with severe AR, significant MR was not seen however echocardiogram was recommended to assess this better.   Saw Larry Campos on November 16, 2021 in the office.  Was overall doing well, did note some dyspnea and occasional sharp sensation in his chest with exertion, not always predictable, relieved with rest.  Denied any syncope or palpitations.  Was referred to structural heart team to consider aortic valve replacement.  Saw Larry Campos on 12/04/2021.  Underwent right and left heart cath on 12/18/21 for preoperative assessment and revealed normal right dominant coronary circulation, CO at 9.48 L/min, mean RAP at 2 mmHg, mean wedge pressure at 9 mmHg, mean PA pressure at 30 mmHg, and LVEDP at 9 mmHg, mean gradient across aortic valve at 31 mmHg. Was referred to cardiothoracic surgery for further evaluation and underwent replacement of ascending aortic aneurysm using a 26 mm Hemashield Platinum graft and underwent Bentall procedure using a mechanical valve conduit with reimplantation of coronary arteries on 01/08/2022. Anticoagulated with Coumadin and has been following our Coumadin clinic since d/c.   Today he presents for follow-up evaluation with his wife. He states he is doing very well since his cardiac surgery.  He denies any chest pain or shortness of breath.  Does have some occasional neck soreness after surgery.  Denies any palpitations, orthopnea, PND, swelling, significant weight changes, syncope, presyncope, dizziness, acute bleeding, or claudication.  He is scheduled for an INR check at Coumadin clinic today.  Tolerating his medications very well.  Scheduled for follow-up with Dr. Laneta Campos next week.  Denies  any other questions or concerns today.  Past Medical History:  Diagnosis Date   Ascending aortic aneurysm (HCC)    s/p repair 12/2021   Congenital heart disease    Congenital bicuspid AV disease, s/p AVR with  mechanical valve 12/2021   Seasonal allergies    Unicuspid aortic valve     Past Surgical History:  Procedure Laterality Date   2D echo     Echocardiogram done 08/20/19 showed an EF of > 55% withmoderate AI and a AV Mean Grad of 38 mm Hg. b.3/2023aortic stenosis with moderate to severe eccentric aortic regurgitation and EF of 55-60%   BENTALL PROCEDURE N/A 01/08/2022   Procedure: BENTALL PROCEDURE;  Surgeon: Larry Campos, Larry K, MD;  Location: [facility] OR;  Service: Open Heart Surgery;  Laterality: N/A;  CIRC ARREST   I & D EXTREMITY Left 12/06/2018   Procedure:Hand IRRIGATION AND DEBRIDEMENT EXTREMITY;  Surgeon: Dominica SeverinGramig, William, MD;  Location: [facility] OR;  Service: Orthopedics;  Laterality: Left;   RIGHT/LEFT HEART CATH AND CORONARY ANGIOGRAPHY N/A 12/18/2021   Procedure: RIGHT/LEFT HEART CATH AND CORONARY ANGIOGRAPHY;  Surgeon: Orbie Pyohukkani, Larry K, MD;  Location: [facility] INVASIVE CV LAB;  Service: Cardiovascular;  Laterality: N/A;   TEE WITHOUT CARDIOVERSION N/A 01/08/2022   Procedure: TRANSESOPHAGEAL ECHOCARDIOGRAM (TEE);  Surgeon: Larry Campos, Larry K, MD;  [facility];  Service: Open Heart Surgery;  Laterality: N/A;   TONSILLECTOMY AND ADENOIDECTOMY  12/07/2010   Procedure: TONSILLECTOMY AND ADENOIDECTOMY;  Surgeon: Larry Campos;  Location: [facility] ORS;  Service: ENT;  Laterality: Bilateral;    Current Medications: Current Meds  Medication Sig   aspirin EC 81 MG tablet Take 1 tablet (81 mg total) by mouth daily. Swallow whole.   cetirizine (ZYRTEC) 10 MG tablet Take 10 mg by mouth in the morning.   metoprolol tartrate (LOPRESSOR) 25 MG tablet Take 1 tablet (25 mg total) by mouth 2 (two) times daily.   oxyCODONE (OXY IR/ROXICODONE) 5 MG immediate release tablet Take 1 tablet (5 mg total) by mouth every 6 (six) hours as needed for severe pain.   VITAMIN D PO Take 2,000 Units by mouth in the morning.   warfarin (COUMADIN) 2.5 MG tablet Take 1 tablet (2.5 mg total) by mouth daily at 4 PM. Or take as directed      Allergies:   Amoxicillin and Penicillins   Social History   Socioeconomic History   Marital status: Married    Spouse name: Not on file   Number of children: Not on file   Years of education: Not on file   Highest education level: Not on file  Occupational History   Not on file  Tobacco Use   Smoking status: Never    Passive exposure: Never   Smokeless tobacco: Never  Vaping Use   Vaping Use: Former   Quit date: 02/26/2021   Substances: Nicotine, Flavoring  Substance and Sexual Activity   Alcohol use: Yes    Comment: occ beer/liquor   Drug use: No   Sexual activity: Yes  Other Topics Concern   Not on file  Social History Narrative   Not on file   Social Determinants of Health   Financial Resource Strain: Not on file  Food Insecurity: No Food Insecurity (01/11/2022)   Hunger Vital Sign    Worried About Running Out of Food in the Last Year: Never true    Ran Out of Food in the Last Year: Never true  Transportation Needs: No Transportation Needs (01/11/2022)   PRAPARE -  Administrator, Civil Service (Medical): No    Lack of Transportation (Non-Medical): No  Physical Activity: Not on file  Stress: Not on file  Social Connections: Not on file     Campos History: The patient's Campos history includes Coronary artery disease in his father; Healthy in his brother, mother, sister, and sister.  ROS:   Review of Systems  Constitutional: Negative.   HENT: Negative.    Eyes: Negative.   Respiratory: Negative.    Cardiovascular: Negative.   Gastrointestinal: Negative.   Genitourinary: Negative.   Musculoskeletal:  Positive for neck pain. Negative for back pain, falls, joint pain and myalgias.  Skin: Negative.   Neurological: Negative.   Endo/Heme/Allergies: Negative.   Psychiatric/Behavioral: Negative.      Please see the history of present illness.    All other systems reviewed and are negative.  EKGs/Labs/Other Studies Reviewed:    The following  studies were reviewed today:   EKG:  EKG is not ordered today.   TEE on January 08, 2022: Left Ventricle: The left ventricle is unchanged from pre-bypass.  _ Right Ventricle: The right ventricle appears unchanged from pre-bypass.  _ Aorta: A graft was placed in the ascending aorta for repair.  _ Left Atrial Appendage: The left atrial appendage appears unchanged from  pre-bypass.  _ Aortic Valve: A mechanical valve was placed, leaflets are freely mobile  Size;  29mm. No regurgitation post repair. Normal washing jets for valve type.  The  gradient recorded across the prosthetic valve is within the expected  range.  _ Mitral Valve: The mitral valve appears unchanged from pre-bypass.  _ Tricuspid Valve: The tricuspid valve appears unchanged from pre-bypass.  _ Pulmonic Valve: The pulmonic valve appears unchanged from pre-bypass.  _ Interatrial Septum: The interatrial septum appears unchanged from  pre-bypass.  Right and left heart cath on December 18, 2021: 1.  Normal right dominant coronary circulation. 2.  Fick cardiac output of 9.48 L/min and Fick cardiac index of 5.4 L/min/m; mean right atrial pressure of 2 mmHg, mean wedge pressure of 9 mmHg, mean PA pressure of 13 mmHg, and LVEDP of 9 mmHg. 3.  Mean gradient across the aortic valve of 31 mmHg.   Recommendation: Cardiothoracic surgery evaluation.  Cardiac MRI on September 01, 2021: IMPRESSION: 1.  Mild to moderate LV dilation with EF 55%, normal wall motion. 2.  Normal RV size and systolic function, EF 52%. 3.  Mildly dilated aortic root and ascending aorta. 4. Bicuspid aortic valve with severe aortic regurgitation (regurgitant fraction 55%). There was a degree of aortic stenosis also present better assessed by echo. 5. Visually there did not appear to be significant mitral regurgitation though calculated regurgitant fraction was 20%. Would confirm presence or absence by echo.   6.  No delayed enhancement.  14-day monitor on  August 04, 2021: 1.  Predominant rhythm is sinus with heart rate ranging from 45 bpm up to 149 bpm and average heart rate 80 bpm.   2.  There were rare PACs including atrial couplets representing less than 1% total beats.  There were also rare PVCs including ventricular couplets and triplets representing less than 1% total beats. 3.  No significant arrhythmias or pauses. 4.  Patient triggered events and diary entries corresponded with sinus rhythm and sinus tachycardia.  2D complete echocardiogram on May 25, 2021: 1. Left ventricular ejection fraction, by estimation, is 55 to 60%. The  left ventricle has normal function. The left ventricle has no regional  wall motion abnormalities. Left ventricular diastolic parameters were  normal. The average left ventricular  global longitudinal strain is -19.3 %. The global longitudinal strain is  normal.   2. Right ventricular systolic function is normal. The right ventricular  size is normal. There is normal pulmonary artery systolic pressure. The  estimated right ventricular systolic pressure is 27.8 mmHg.   3. The mitral valve is grossly normal. Trivial mitral valve  regurgitation.   4. The aortic valve is abnormal. Described as unicuspid previously and  does appear that way in some views, in others more bicuspid. Aortic valve  regurgitation is moderate to severe, eccentric (no clear flow reversal in  aortic arch). Moderate aortic  valve stenosis. Aortic regurgitation PHT measures 380 msec. Aortic valve  area, by VTI measures 1.32 cm. Aortic valve mean gradient measures 26.8  mmHg. Dimentiionless index 0.42.   5. Aortic dilatation noted. There is borderline dilatation of the aortic  root, measuring 39 mm.   6. The inferior vena cava is normal in size with greater than 50%  respiratory variability, suggesting right atrial pressure of 3 mmHg.   Comparison(s): Prior images reviewed side by side. Overall moderate aortic  stenosis and moderate to  severe, eccentric aortic regurgitation.  Recent Labs: 01/04/2022: ALT 11 01/09/2022: Magnesium 2.1 01/13/2022: BUN 17; Creatinine, Ser 0.88; Hemoglobin 9.5; Platelets 179; Potassium 3.6; Sodium 138  Recent Lipid Panel No results found for: "CHOL", "TRIG", "HDL", "CHOLHDL", "VLDL", "LDLCALC", "LDLDIRECT"   Physical Exam:    VS:  BP 114/69   Pulse 76   Ht 5\' 8"  (1.727 m)   Wt 131 lb (59.4 kg)   SpO2 96%   BMI 19.92 kg/m     Wt Readings from Last 3 Encounters:  01/29/22 131 lb (59.4 kg)  01/14/22 132 lb 4.8 oz (60 kg)  01/04/22 136 lb 1.6 oz (61.7 kg)     GEN: Well nourished, well developed 26 year old male in no acute distress HEENT: Normal NECK: No JVD; No carotid bruits CARDIAC: S1/S2, RRR, no murmurs, rubs, gallops, with normal/crisp open and closure of mechanical AV; 2+ peripheral pulses throughout, strong and equal bilaterally; sternotomy incision site is adhered well and clean, dry, and approximated, healing well RESPIRATORY:  Clear and diminished to auscultation without rales, wheezing or rhonchi  MUSCULOSKELETAL:  No edema; No deformity  SKIN: Warm and dry NEUROLOGIC:  Alert and oriented x 3 PSYCHIATRIC:  Normal affect   ASSESSMENT:    1. S/P AVR (aortic valve replacement)   2. Congenital bicuspid aortic valve   3. Aortic valve stenosis, etiology of cardiac valve disease unspecified   4. Current use of long term anticoagulation   5. S/P ascending aortic aneurysm repair   6. Anemia, unspecified type    PLAN:    In order of problems listed above:  Congenital bicuspid aortic valve with aortic stenosis, status post aortic valve replacement with mechanical valve in November 2023, long term anticoagulation Doing well post surgery. Surgical site is healing well.  TEE revealed no regurgitation no aortic valve replacement with normal mean gradients across the valve.  Normal valve function noted on exam.  Continue Lopressor and aspirin.  Continue to follow-up with  Coumadin clinic today and as scheduled.  Continue to follow-up with cardiothoracic surgery next week as scheduled. Repeat TTE ordered for later this month. Heart healthy diet and regular cardiovascular exercise as tolerated encouraged. Okay to start cardiac rehab.  Cardiac Rehabilitation Eligibility Assessment  The patient is ready to start cardiac rehabilitation  from a cardiac standpoint.  Ascending aortic aneurysm, status post repair in November 2023 Doing well after he underwent replacement of ascending aortic aneurysm with a 26 mm Hemashield platinum graft along with AVR.  Continue current medication regimen.  He is scheduled for repeat TTE later on this month.  Continue to follow-up with cardiothoracic surgery. Heart healthy diet and regular cardiovascular exercise as tolerated encouraged.   Acute blood loss anemia related to surgery Did experience acute blood loss anemia surrounding surgery as mentioned above. Last H&H was 9.5 ad 27.5. Denies any bleeding issues while on Coumadin. Will repeat CBC at this time.   Disposition: Follow-up with Larry Campos in 3 months or sooner if anything changes  Medication Adjustments/Labs and Tests Ordered: Current medicines are reviewed at length with the patient today.  Concerns regarding medicines are outlined above.  Orders Placed This Encounter  Procedures   CBC   No orders of the defined types were placed in this encounter.   Patient Instructions  Medication Instructions:  Your physician recommends that you continue on your current medications as directed. Please refer to the Current Medication list given to you today.   Labwork: CBC  Testing/Procedures: None today  Follow-Up: 3 months Larry[facility]Dowell  Any Other Special Instructions Will Be Listed Below (If Applicable).  If you need a refill on your cardiac medications before your next appointment, please call your pharmacy.    SignedSharlene Dory, NP  01/29/2022 11:24 AM    [facility]

## 2022-01-29 NOTE — Patient Instructions (Signed)
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 (04/17/22) Increase warfarin to 1 tablet daily Recheck in 2 wk

## 2022-01-30 LAB — CBC
Hematocrit: 30.8 % — ABNORMAL LOW (ref 37.5–51.0)
Hemoglobin: 9.9 g/dL — ABNORMAL LOW (ref 13.0–17.7)
MCH: 28.8 pg (ref 26.6–33.0)
MCHC: 32.1 g/dL (ref 31.5–35.7)
MCV: 90 fL (ref 79–97)
Platelets: 460 10*3/uL — ABNORMAL HIGH (ref 150–450)
RBC: 3.44 x10E6/uL — ABNORMAL LOW (ref 4.14–5.80)
RDW: 12 % (ref 11.6–15.4)
WBC: 9.5 10*3/uL (ref 3.4–10.8)

## 2022-01-31 DIAGNOSIS — Z13220 Encounter for screening for lipoid disorders: Secondary | ICD-10-CM | POA: Diagnosis not present

## 2022-02-05 ENCOUNTER — Other Ambulatory Visit: Payer: Self-pay

## 2022-02-05 ENCOUNTER — Telehealth: Payer: Self-pay | Admitting: *Deleted

## 2022-02-05 ENCOUNTER — Emergency Department (HOSPITAL_COMMUNITY): Payer: BC Managed Care – PPO

## 2022-02-05 ENCOUNTER — Encounter (HOSPITAL_COMMUNITY): Payer: Self-pay

## 2022-02-05 ENCOUNTER — Emergency Department (HOSPITAL_COMMUNITY)
Admission: EM | Admit: 2022-02-05 | Discharge: 2022-02-06 | Disposition: A | Discharge status: Home / Self Care | Payer: BC Managed Care – PPO | Attending: Emergency Medicine | Admitting: Emergency Medicine

## 2022-02-05 DIAGNOSIS — J90 Pleural effusion, not elsewhere classified: Secondary | ICD-10-CM | POA: Insufficient documentation

## 2022-02-05 DIAGNOSIS — J9811 Atelectasis: Secondary | ICD-10-CM | POA: Diagnosis not present

## 2022-02-05 DIAGNOSIS — R0602 Shortness of breath: Secondary | ICD-10-CM | POA: Diagnosis not present

## 2022-02-05 DIAGNOSIS — R079 Chest pain, unspecified: Secondary | ICD-10-CM | POA: Diagnosis not present

## 2022-02-05 LAB — COMPREHENSIVE METABOLIC PANEL
ALT: 12 U/L (ref 0–44)
AST: 17 U/L (ref 15–41)
Albumin: 3.5 g/dL (ref 3.5–5.0)
Alkaline Phosphatase: 56 U/L (ref 38–126)
Anion gap: 9 (ref 5–15)
BUN: 10 mg/dL (ref 6–20)
CO2: 27 mmol/L (ref 22–32)
Calcium: 9.3 mg/dL (ref 8.9–10.3)
Chloride: 102 mmol/L (ref 98–111)
Creatinine, Ser: 0.99 mg/dL (ref 0.61–1.24)
GFR, Estimated: >60 mL/min (ref >60)
Glucose, Bld: 123 mg/dL — ABNORMAL HIGH (ref 70–99)
Potassium: 3.8 mmol/L (ref 3.5–5.1)
Sodium: 138 mmol/L (ref 135–145)
Total Bilirubin: 0.6 mg/dL (ref 0.3–1.2)
Total Protein: 7.2 g/dL (ref 6.5–8.1)

## 2022-02-05 LAB — CBC WITH DIFFERENTIAL/PLATELET
Abs Immature Granulocytes: 0.06 10*3/uL (ref 0.00–0.07)
Basophils Absolute: 0 10*3/uL (ref 0.0–0.1)
Basophils Relative: 0 %
Eosinophils Absolute: 0.4 10*3/uL (ref 0.0–0.5)
Eosinophils Relative: 3 %
HCT: 34.6 % — ABNORMAL LOW (ref 39.0–52.0)
Hemoglobin: 10.9 g/dL — ABNORMAL LOW (ref 13.0–17.0)
Immature Granulocytes: 1 %
Lymphocytes Relative: 13 %
Lymphs Abs: 1.5 10*3/uL (ref 0.7–4.0)
MCH: 27.8 pg (ref 26.0–34.0)
MCHC: 31.5 g/dL (ref 30.0–36.0)
MCV: 88.3 fL (ref 80.0–100.0)
Monocytes Absolute: 0.8 10*3/uL (ref 0.1–1.0)
Monocytes Relative: 7 %
Neutro Abs: 9.1 10*3/uL — ABNORMAL HIGH (ref 1.7–7.7)
Neutrophils Relative %: 76 %
Platelets: 409 10*3/uL — ABNORMAL HIGH (ref 150–400)
RBC: 3.92 MIL/uL — ABNORMAL LOW (ref 4.22–5.81)
RDW: 12.6 % (ref 11.5–15.5)
WBC: 11.9 10*3/uL — ABNORMAL HIGH (ref 4.0–10.5)
nRBC: 0 % (ref 0.0–0.2)

## 2022-02-05 LAB — PROTIME-INR
INR: 1.7 — ABNORMAL HIGH (ref 0.8–1.2)
Prothrombin Time: 19.9 seconds — ABNORMAL HIGH (ref 11.4–15.2)

## 2022-02-05 LAB — TROPONIN I (HIGH SENSITIVITY): Troponin I (High Sensitivity): 8 ng/L (ref <18)

## 2022-02-05 MED ORDER — HYDROCODONE-ACETAMINOPHEN 5-325 MG PO TABS
1.0000 | ORAL_TABLET | Freq: Once | ORAL | Status: AC
  Administered 2022-02-05: 1 via ORAL
  Filled 2022-02-05: qty 1

## 2022-02-05 MED ORDER — IOHEXOL 350 MG/ML SOLN
75.0000 mL | Freq: Once | INTRAVENOUS | Status: AC | PRN
  Administered 2022-02-05: 75 mL via INTRAVENOUS

## 2022-02-05 NOTE — Telephone Encounter (Signed)
Patient contacted the office stating he experiences left sided chest pain while lying down. Patient states he feels like he can't catch his breath when he goes to bed and has to sit up. Patient states pain and SOB improve when sitting up. Advised a chest xray order can  be placed, however, given the time of day he may have to get tomorrow. Per patient, he has childcare tonight and will seek care via the ED for further evaluation. Patient reports he will go to [facility] ED tonight. Nothing further at this time.

## 2022-02-05 NOTE — ED Provider Triage Note (Signed)
Emergency Medicine Provider Triage Evaluation Note  Larry Campos , a 26 y.o. male  was evaluated in triage.  Pt complains of left pleuritic chest pain starting last night. Recent aortic valve replacement (mechanical) by [facility]. Compliant with warfarin.  Review of Systems  Positive: Chest pain, SOB Negative: Fever, chills, rash  Physical Exam  BP 116/71   Pulse 83   Temp 98.8 F (37.1 C) (Oral)   Resp 12   Ht 5\' 8"  (1.727 m)   Wt 59 kg   SpO2 100%   BMI 19.77 kg/m  Gen:   Awake, no distress   Resp:  Normal effort  MSK:   Moves extremities without difficulty  Other:  Well healing sternotomy wound  Medical Decision Making  Medically screening exam initiated at 7:45 PM.  Appropriate orders placed.  . was informed that the remainder of the evaluation will be completed by another provider, this initial triage assessment does not replace that evaluation, and the importance of remaining in the ED until their evaluation is complete.     Larry Campos, Pete Pelt 02/05/22 1947

## 2022-02-05 NOTE — ED Triage Notes (Addendum)
Patient arrives to ED POV c/o [facility] that started yesterday. Patient had open heart surgery for aortic repair a month ago and states when he lies down it is hard to breathe. Pt states his left lung hurts.Pt a/o x4 no other complaints at this time. Pt is on Warfarin

## 2022-02-06 ENCOUNTER — Other Ambulatory Visit: Payer: Self-pay | Admitting: Surgery

## 2022-02-06 DIAGNOSIS — I7121 Aneurysm of the ascending aorta, without rupture: Secondary | ICD-10-CM

## 2022-02-06 LAB — TROPONIN I (HIGH SENSITIVITY): Troponin I (High Sensitivity): 6 ng/L (ref <18)

## 2022-02-06 NOTE — ED Provider Notes (Signed)
[facility] Emergency Department Provider Note MRN:  [id number]  Arrival date & time: 02/06/22     Chief Complaint   Shortness of Breath   History of Present Illness   Larry Seever. is a 26 y.o. year-old male with a history of aortic aneurysm, bicuspid aortic valve status postrepair presenting to the ED with chief complaint of shortness of breath.  Patient endorses pain to the left lung for the past 2 days.  Also has some dyspnea when lying flat.  Denies fever, no cough, no other symptoms or complaints.  Review of Systems  A thorough review of systems was obtained and all systems are negative except as noted in the HPI and PMH.   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 b.3/2023aortic stenosis with moderate to severe eccentric aortic regurgitation and EF of 55-60%   BENTALL PROCEDURE N/A 01/08/2022   Procedure: BENTALL PROCEDURE;  Surgeon: Gaye Pollack, MD;  Location: [facility];  Service: Open Heart Surgery;  Laterality: N/A;  CIRC ARREST   I & D EXTREMITY Left 12/06/2018   Procedure:Hand IRRIGATION AND DEBRIDEMENT EXTREMITY;  Surgeon: Roseanne Kaufman, MD;  Location: [place];  Service: Orthopedics;  Laterality: Left;   RIGHT/LEFT HEART CATH AND CORONARY ANGIOGRAPHY N/A 12/18/2021   Procedure: RIGHT/LEFT HEART CATH AND CORONARY ANGIOGRAPHY;  Surgeon: Early Osmond, MD;  Location: [place] [facility];  Service: Cardiovascular;  Laterality: N/A;   TEE WITHOUT CARDIOVERSION N/A 01/08/2022   Procedure: TRANSESOPHAGEAL ECHOCARDIOGRAM (TEE);  Surgeon: Gaye Pollack, MD;  Location: [place];  Service: Open Heart Surgery;  Laterality: N/A;   TONSILLECTOMY AND ADENOIDECTOMY  12/07/2010   Procedure: TONSILLECTOMY AND ADENOIDECTOMY;  Surgeon: Ascencion Dike;  Location: [facility];  Service: ENT;  Laterality: Bilateral;    Family History  Problem Relation Age of Onset   Healthy Mother    Coronary artery disease Father    Healthy Sister    Healthy Sister    Healthy Brother     Social History   Socioeconomic History   Marital status: Married    Spouse name: Not on file   Number of children: Not on file   Years of education: Not on file   Highest education level: Not on file  Occupational History   Not on file  Tobacco Use   Smoking status: Never    Passive exposure: Never   Smokeless tobacco: Never  Vaping Use   Vaping Use: Former   Quit date: 02/26/2021   Substances: Nicotine, Flavoring  Substance and Sexual Activity   Alcohol use: Yes    Comment: occ beer/liquor   Drug use: No   Sexual activity: Yes  Other Topics Concern   Not on file  Social History Narrative   Not on file   Social Determinants of Health   Financial Resource Strain: Not on file  Food Insecurity: No Food Insecurity (01/11/2022)   Hunger Vital Sign    Worried About Running Out of Food in the Last  Year: Never true    [place] in the Last Year: Never true  Transportation Needs: No Transportation Needs (01/11/2022)   PRAPARE - Hydrologist (Medical): No    Lack of Transportation (Non-Medical): No  Physical Activity: Not on file  Stress: Not on file  Social Connections: Not on file  Intimate Partner Violence: Not At Risk (01/11/2022)   Humiliation, Afraid, Rape, and Kick questionnaire    Fear of Current or Ex-Partner: No    Emotionally Abused: No    Physically Abused: No    Sexually Abused: No     Physical Exam   Vitals:   02/06/22 0145 02/06/22 0300  BP: 111/65 99/65  Pulse: 86 85  Resp: (!) 27 15  Temp:    SpO2: 100% 98%     CONSTITUTIONAL: Well-appearing, NAD NEURO/PSYCH:  Alert and oriented x 3, no focal deficits EYES:  eyes equal and reactive ENT/NECK:  no LAD, no JVD CARDIO: Regular rate, well-perfused, normal S1 and S2 PULM:  CTAB no wheezing or rhonchi GI/GU:  non-distended, non-tender MSK/SPINE:  No gross deformities, no edema SKIN:  no rash, atraumatic   *Additional and/or pertinent findings included in MDM below  Diagnostic and Interventional Summary    EKG Interpretation  Date/Time:  Monday February 05 2022 19:33:03 EST Ventricular Rate:  91 PR Interval:  198 QRS Duration: 110 QT Interval:  366 QTC Calculation: 450 R Axis:   36 Text Interpretation: Normal sinus rhythm ST & T wave abnormality, consider inferolateral ischemia Abnormal ECG When compared with ECG of 09-Jan-2022 06:40, PREVIOUS ECG IS PRESENT Confirmed by Gerlene Fee [phone]) on 02/06/2022 1:26:10 AM       Labs Reviewed  CBC WITH DIFFERENTIAL/PLATELET - Abnormal; Notable for the following components:      Result Value   WBC 11.9 (*)    RBC 3.92 (*)    Hemoglobin 10.9 (*)    HCT 34.6 (*)    Platelets 409 (*)    Neutro Abs 9.1 (*)    All other components within normal limits  COMPREHENSIVE METABOLIC PANEL - Abnormal; Notable for the following components:   Glucose, Bld 123 (*)    All other components within normal limits  PROTIME-INR - Abnormal; Notable for the following components:   Prothrombin Time 19.9 (*)    INR 1.7 (*)    All other components within normal limits  TROPONIN I (HIGH SENSITIVITY)  TROPONIN I (HIGH SENSITIVITY)    CT Angio Chest PE W and/or Wo Contrast  Final Result      Medications  HYDROcodone-acetaminophen (NORCO/VICODIN) 5-325 MG per tablet 1 tablet (1 tablet Oral Given 02/05/22 1954)  iohexol (OMNIPAQUE) 350 MG/ML injection 75 mL (75 mLs Intravenous Contrast Given 02/05/22 2022)     Procedures  /  Critical Care Procedures  ED Course and Medical Decision Making  Initial Impression  and Ddx Differential diagnosis includes pneumonia, pneumothorax, PE, or other complication of recent surgery.  Patient is extremely well-appearing, resting comfortably, reassuring vital signs, no acute distress.  Highly doubt significant vascular or aortic complication.  Past medical/surgical history that increases complexity of ED encounter: Bicuspid aortic valve and aortic aneurysm status post corrective surgery  Interpretation of Diagnostics I personally reviewed the EKG and my interpretation is as follows: Sinus rhythm  Workup overall reassuring with no significant blood count or electrolyte disturbance.  Mildly subtherapeutic INR.  CT imaging is without obvious complications of the aortic repair or valve.  Does have  a new left-sided pleural effusion which may be causing patient's symptoms.  Patient Reassessment and Ultimate Disposition/Management     Patient continues to look and feel well, he is comfortable and without symptoms at this time.  No indication for further testing or admission, consultation with cardiothoracic surgery was considered however patient has follow-up tomorrow with Dr. Lavinia Sharps.  Strict return precautions.  Patient management required discussion with the following services or consulting groups:  None  Complexity of Problems Addressed Acute illness or injury that poses threat of life of bodily function  Additional Data Reviewed and Analyzed Further history obtained from: Further history from spouse/family member  Additional Factors Impacting ED Encounter Risk None  Larry Sow. Pilar Plate, MD [facility] Emergency Medicine [facility] [email or web address]  Final Clinical Impressions(s) / ED Diagnoses     ICD-10-CM   1. Pleural effusion  J90       ED Discharge Orders     None        Discharge Instructions Discussed with and Provided to Patient:    Discharge Instructions      You were evaluated in the Emergency Department and after  careful evaluation, we did not find any emergent condition requiring admission or further testing in the hospital.  Your exam/testing today was overall reassuring.  Symptoms seem to be due to a small collection of fluid around the left lung.  Continue your pain medicine at home and keep your close follow-up with your cardiothoracic surgeon to discuss your symptoms.  Please return to the Emergency Department if you experience any worsening of your condition.  Thank you for allowing [place] to be a part of your care.       Sabas Sous, MD 02/06/22 [phone]

## 2022-02-06 NOTE — Discharge Instructions (Signed)
You were evaluated in the Emergency Department and after careful evaluation, we did not find any emergent condition requiring admission or further testing in the hospital.  Your exam/testing today was overall reassuring.  Symptoms seem to be due to a small collection of fluid around the left lung.  Continue your pain medicine at home and keep your close follow-up with your cardiothoracic surgeon to discuss your symptoms.  Please return to the Emergency Department if you experience any worsening of your condition.  Thank you for allowing [place] to be a part of your care.

## 2022-02-07 ENCOUNTER — Ambulatory Visit
Admission: RE | Admit: 2022-02-07 | Discharge: 2022-02-07 | Disposition: A | Discharge status: Home / Self Care | Payer: BC Managed Care – PPO | Source: Ambulatory Visit | Attending: Surgery | Admitting: Surgery

## 2022-02-07 ENCOUNTER — Ambulatory Visit (INDEPENDENT_AMBULATORY_CARE_PROVIDER_SITE_OTHER): Payer: Self-pay | Admitting: Surgery

## 2022-02-07 ENCOUNTER — Encounter: Payer: Self-pay | Admitting: Surgery

## 2022-02-07 VITALS — BP 106/71 | HR 80 | Resp 20 | Ht 68.0 in | Wt 133.0 lb

## 2022-02-07 DIAGNOSIS — I7121 Aneurysm of the ascending aorta, without rupture: Secondary | ICD-10-CM

## 2022-02-07 DIAGNOSIS — Z952 Presence of prosthetic heart valve: Secondary | ICD-10-CM | POA: Diagnosis not present

## 2022-02-07 DIAGNOSIS — Z09 Encounter for follow-up examination after completed treatment for conditions other than malignant neoplasm: Secondary | ICD-10-CM

## 2022-02-07 DIAGNOSIS — J90 Pleural effusion, not elsewhere classified: Secondary | ICD-10-CM | POA: Diagnosis not present

## 2022-02-07 DIAGNOSIS — R918 Other nonspecific abnormal finding of lung field: Secondary | ICD-10-CM | POA: Diagnosis not present

## 2022-02-07 MED ORDER — WARFARIN SODIUM 2.5 MG PO TABS: 2.5000 mg | ORAL_TABLET | Freq: Every day | ORAL | 1 refills | Status: DC

## 2022-02-07 MED ORDER — COLCHICINE 0.6 MG PO TABS: 0.6000 mg | ORAL_TABLET | Freq: Every day | ORAL | 0 refills | Status: DC

## 2022-02-07 MED ORDER — OXYCODONE HCL 5 MG PO TABS: 5.0000 mg | ORAL_TABLET | Freq: Four times a day (QID) | ORAL | 0 refills | Status: DC | PRN

## 2022-02-07 MED ORDER — METOPROLOL TARTRATE 25 MG PO TABS: 25.0000 mg | ORAL_TABLET | Freq: Two times a day (BID) | ORAL | 1 refills | Status: DC

## 2022-02-07 MED ORDER — PREDNISONE 5 MG PO TABS: ORAL_TABLET | ORAL | 0 refills | Status: AC

## 2022-02-07 NOTE — Progress Notes (Signed)
HPI: Patient returns for routine postoperative follow-up having undergone Bentall procedure using a 27/29 mm On-X Mechanical valve conduit with reimplantation of coronary arteries and replacement of the ascending aortic aneurysm ( hemi-arch) using a 26 mm Hemashield Platinum graft under deep hypothermic circulatory arrest on 01/08/2022. The patient's early postoperative recovery while in the hospital was notable for an uncomplicated postoperative course. Since hospital discharge the patient reports that he was doing well until 02/04/2022 when he began developing pleuritic left chest pain that was made worse by walking and laying down.  He denies any shortness of breath.  He was seen in the emergency room and underwent a CTA of the chest which ruled out pulmonary embolus.  There was a small left pleural effusion but no pericardial effusion.  His wife is with him today.  He denies any fever or chills.   Current Outpatient Medications  Medication Sig Dispense Refill   aspirin EC 81 MG tablet Take 1 tablet (81 mg total) by mouth daily. Swallow whole. 30 tablet 12   cetirizine (ZYRTEC) 10 MG tablet Take 10 mg by mouth in the morning.     metoprolol tartrate (LOPRESSOR) 25 MG tablet Take 1 tablet (25 mg total) by mouth 2 (two) times daily. 60 tablet 1   metoprolol tartrate (LOPRESSOR) 25 MG tablet Take 1 tablet (25 mg total) by mouth 2 (two) times daily. 60 tablet 1   VITAMIN D PO Take 2,000 Units by mouth in the morning.     oxyCODONE (OXY IR/ROXICODONE) 5 MG immediate release tablet Take 1 tablet (5 mg total) by mouth every 6 (six) hours as needed for severe pain. 30 tablet 0   warfarin (COUMADIN) 2.5 MG tablet Take 1 tablet (2.5 mg total) by mouth daily at 4 PM. Or take as directed 30 tablet 1   No current facility-administered medications for this visit.    Physical Exam: BP 106/71   Pulse 80   Resp 20   Ht 5\' 8"  (1.727 m)   Wt 133 lb (60.3 kg)   SpO2 98% Comment: RA  BMI 20.22 kg/m  He  looks well. Cardiac exam shows regular rate and rhythm with a crisp mechanical valve click.  There is no murmur.  There is no rub. Lung exam reveals decreased breath sounds at the left base. The chest incision is healing well and the sternum is stable. There is no peripheral edema.  Diagnostic Tests:  Narrative & Impression  CLINICAL DATA:  One month status post Bentall procedure.   EXAM: CHEST - 2 VIEW   COMPARISON:  01/10/2022   FINDINGS: Visible prosthetic in the vicinity of the aortic valve. Prior median sternotomy. Heart size within normal limits.   Moderate left pleural effusion. Retrocardiac opacity likely relates to the pleural effusion.   Previous tubes and lines are absent. The right lung appears clear. No pulmonary edema.   IMPRESSION: 1. Moderate left pleural effusion with retrocardiac opacity likely from passive atelectasis and effusion. 2. Prior median sternotomy. Visible prosthetic in the vicinity of the aortic valve. No pulmonary edema. Heart size within normal limits.     Electronically Signed   By: 01/12/2022 M.D.   On: 02/07/2022 11:45        Impression:  Overall I think he is doing well 1 month following his surgery.  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  Since he is on Coumadin for his mechanical valve we will avoid nonsteroidal anti-inflammatory agents.  I will start him on colchicine 0.6 mg daily for 14 days.  I will also start him on a prednisone taper with 10 mg daily for 3 days, 5 mg daily for 5 days, and then 2.5 mg daily for 6 days.  I told him that he could return to driving a car but should refrain from lifting anything heavier than 10 pounds for 3 months postoperatively.  His INR was checked on 02/05/2022 and was somewhat low at 1.7.  His Coumadin was increased to 2.5 mg daily by the Coumadin clinic.  I also renewed his oxycodone, Lopressor and warfarin today.  Plan:  I will plan to see him  back in 2 weeks with a chest x-ray.  He will call us immediately if he develops any increasing pain or shortness of breath.   [name], MD [facility] [phone]

## 2022-02-12 ENCOUNTER — Ambulatory Visit: Payer: BC Managed Care – PPO | Attending: Cardiology | Admitting: *Deleted

## 2022-02-12 DIAGNOSIS — Z5181 Encounter for therapeutic drug level monitoring: Secondary | ICD-10-CM

## 2022-02-12 DIAGNOSIS — Z952 Presence of prosthetic heart valve: Secondary | ICD-10-CM | POA: Diagnosis not present

## 2022-02-12 LAB — POCT INR: INR: 2 (ref 2.0–3.0)

## 2022-02-12 NOTE — Patient Instructions (Signed)
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 (04/17/22) Continue warfarin 1 tablet daily Recheck in 2 wk

## 2022-02-21 ENCOUNTER — Ambulatory Visit (HOSPITAL_COMMUNITY)
Admission: RE | Admit: 2022-02-21 | Discharge: 2022-02-21 | Disposition: A | Discharge status: Home / Self Care | Payer: BC Managed Care – PPO | Source: Ambulatory Visit | Attending: Family Medicine | Admitting: Family Medicine

## 2022-02-21 DIAGNOSIS — Z954 Presence of other heart-valve replacement: Secondary | ICD-10-CM

## 2022-02-21 DIAGNOSIS — Z9889 Other specified postprocedural states: Secondary | ICD-10-CM | POA: Insufficient documentation

## 2022-02-21 LAB — ECHOCARDIOGRAM COMPLETE
AR max vel: 1.44 cm2
AV Area VTI: 1.61 cm2
AV Area mean vel: 1.4 cm2
AV Mean grad: 6.7 mmHg
AV Peak grad: 12.5 mmHg
Ao pk vel: 1.77 m/s
Area-P 1/2: 3.91 cm2
S' Lateral: 3.1 cm

## 2022-02-21 NOTE — Progress Notes (Signed)
*  PRELIMINARY RESULTS* Echocardiogram 2D Echocardiogram has been performed.  Stacey Drain 02/21/2022, 9:48 AM

## 2022-02-27 ENCOUNTER — Ambulatory Visit: Payer: BC Managed Care – PPO | Attending: Cardiology | Admitting: *Deleted

## 2022-02-27 DIAGNOSIS — Z952 Presence of prosthetic heart valve: Secondary | ICD-10-CM | POA: Diagnosis not present

## 2022-02-27 DIAGNOSIS — Z5181 Encounter for therapeutic drug level monitoring: Secondary | ICD-10-CM | POA: Diagnosis not present

## 2022-02-27 LAB — POCT INR: INR: 1.5 — AB (ref 2.0–3.0)

## 2022-02-27 NOTE — Patient Instructions (Signed)
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 (04/17/22) Take warfarin 2 tablets tonight then increase dose to 1 tablet daily except 1 1/2 tablets on Wednesdays Recheck in 2 wks

## 2022-02-28 ENCOUNTER — Other Ambulatory Visit: Payer: Self-pay | Admitting: Surgery

## 2022-02-28 DIAGNOSIS — Z951 Presence of aortocoronary bypass graft: Secondary | ICD-10-CM

## 2022-02-28 DIAGNOSIS — Z952 Presence of prosthetic heart valve: Secondary | ICD-10-CM

## 2022-03-01 ENCOUNTER — Ambulatory Visit (INDEPENDENT_AMBULATORY_CARE_PROVIDER_SITE_OTHER): Payer: Self-pay | Admitting: Surgery

## 2022-03-01 ENCOUNTER — Ambulatory Visit
Admission: RE | Admit: 2022-03-01 | Discharge: 2022-03-01 | Disposition: A | Discharge status: Home / Self Care | Payer: BC Managed Care – PPO | Source: Ambulatory Visit | Attending: Surgery | Admitting: Surgery

## 2022-03-01 ENCOUNTER — Encounter: Payer: Self-pay | Admitting: Surgery

## 2022-03-01 VITALS — BP 109/69 | HR 59 | Resp 18 | Ht 68.0 in | Wt 137.0 lb

## 2022-03-01 DIAGNOSIS — Z09 Encounter for follow-up examination after completed treatment for conditions other than malignant neoplasm: Secondary | ICD-10-CM

## 2022-03-01 DIAGNOSIS — Z952 Presence of prosthetic heart valve: Secondary | ICD-10-CM

## 2022-03-01 NOTE — Progress Notes (Signed)
HPI:  Patient returns for routine postoperative follow-up having undergone Bentall procedure using a 27/29 mm On-X Mechanical valve conduit with reimplantation of coronary arteries and replacement of the ascending aortic aneurysm ( hemi-arch) using a 26 mm Hemashield Platinum graft under deep hypothermic circulatory arrest on 01/08/2022.  When I last saw him in the office on 02/07/2022 he was having some pleuritic left chest pain made worse by walking and laying down.  He had been seen in the emergency room and underwent a CTA of the chest which showed a small left pleural effusion but no pericardial effusion. 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 Since he is on Coumadin for his mechanical valve I decided to avoid nonsteroidal anti-inflammatory agents. I started him on colchicine 0.6 mg daily for 14 days. I also started him on a prednisone taper with 10 mg daily for 3 days, 5 mg daily for 5 days, and then 2.5 mg daily for 6 days.  He is with his wife today.  He said that he feels much better.  The left chest pain resolved.  He has been ambulating without chest pain or shortness of breath.  His last INR on 02/27/2022 was 1.5.  His Coumadin dose was increased by the Coumadin clinic and follow-up has been arranged.  He remains on aspirin 81 mg daily in addition to the Coumadin.  Current Outpatient Medications  Medication Sig Dispense Refill   aspirin EC 81 MG tablet Take 1 tablet (81 mg total) by mouth daily. Swallow whole. 30 tablet 12   cetirizine (ZYRTEC) 10 MG tablet Take 10 mg by mouth in the morning.     colchicine 0.6 MG tablet Take 1 tablet (0.6 mg total) by mouth daily. 14 tablet 0   metoprolol tartrate (LOPRESSOR) 25 MG tablet Take 1 tablet (25 mg total) by mouth 2 (two) times daily. 60 tablet 1   oxyCODONE (OXY IR/ROXICODONE) 5 MG immediate release tablet Take 1 tablet (5 mg total) by mouth every 6 (six) hours as needed for severe pain. 30 tablet 0    VITAMIN D PO Take 2,000 Units by mouth in the morning.     warfarin (COUMADIN) 2.5 MG tablet Take 1 tablet (2.5 mg total) by mouth daily at 4 PM. Or take as directed 30 tablet 1   metoprolol tartrate (LOPRESSOR) 25 MG tablet Take 1 tablet (25 mg total) by mouth 2 (two) times daily. (Patient not taking: Reported on 03/01/2022) 60 tablet 1   No current facility-administered medications for this visit.     Physical Exam: BP 109/69   Pulse (!) 59   Resp 18   Ht 5\' 8"  (1.727 m)   Wt 137 lb (62.1 kg)   SpO2 98% Comment: RA  BMI 20.83 kg/m  He looks well. Cardiac exam shows a regular rate and rhythm with a crisp mechanical valve click.  There is no murmur. Lungs are clear. The chest incision is healing well and the sternum is stable. There is no peripheral edema.  Diagnostic Tests:  ECHOCARDIOGRAM REPORT       Patient Name:   Larry Campos. Date of Exam: 02/21/2022  Medical Rec #:  02/23/2022            Height:       68.0 in  Accession #:    [id number]           Weight:       133.0 lb  Date  of Birth:  04-14-1995            BSA:          1.718 m  Patient Age:    26 years             BP:           138/81 mmHg  Patient Gender: M                    HR:           67 bpm.  [facility]   Procedure: 2D Echo, Cardiac Doppler and Color Doppler   Indications:     Z98.890 (ICD-10-CM) - Status post aortic valve repair    History:         Patient has prior history of Echocardiogram examinations,  most                  recent 01/08/2022. Aortic Valve Disease. S/P AVR (aortic  valve                  replacement), S/P ascending aortic aneurysm repair,  Congenital                  heart disease (From Hx).    Sonographer:     Celesta Gentile RCS  Referring Phys:  [id number] Bol[address] MCDOWELL  Diagnosing Phys: Charlton [facility] MD   IMPRESSIONS     1. Left ventricular ejection fraction, by estimation, is 60 to 65%. The  left ventricle has normal function. The left ventricle has no  regional  wall motion abnormalities. Left ventricular diastolic parameters were  normal.   2. Right ventricular systolic function is normal. The right ventricular  size is normal. There is normal pulmonary artery systolic pressure.   3. The mitral valve is normal in structure. No evidence of mitral valve  regurgitation. No evidence of mitral stenosis.   4. Post Bental with AVR 27/29 On-X mechanical valve Normal gradients and  no PVL Small amount of color flow AR seen within valve ? washing jets .  The aortic valve has been repaired/replaced. Aortic valve regurgitation is  trivial. No aortic stenosis is  present.   5. The inferior vena cava is normal in size with greater than 50%  respiratory variability, suggesting right atrial pressure of 3 mmHg.   FINDINGS   Left Ventricle: Left ventricular ejection fraction, by estimation, is 60  to 65%. The left ventricle has normal function. The left ventricle has no  regional wall motion abnormalities. The left ventricular internal cavity  size was normal in size. There is   no left ventricular hypertrophy. Left ventricular diastolic parameters  were normal.   Right Ventricle: The right ventricular size is normal. No increase in  right ventricular wall thickness. Right ventricular systolic function is  normal. There is normal pulmonary artery systolic pressure. The tricuspid  regurgitant velocity is 1.64 m/s, and   with an assumed right atrial pressure of 3 mmHg, the estimated right  ventricular systolic pressure is 13.8 mmHg.   Left Atrium: Left atrial size was normal in size.   Right Atrium: Right atrial size was normal in size.   Pericardium: There is no evidence of pericardial effusion.   Mitral Valve: The mitral valve is normal in structure. No evidence of  mitral valve regurgitation. No evidence of mitral valve stenosis.   Tricuspid Valve: The tricuspid valve is normal in structure. Tricuspid  valve  regurgitation is not  demonstrated. No evidence of tricuspid  stenosis.   Aortic Valve: Post Bental with AVR 27/29 On-X mechanical valve Normal  gradients and no PVL Small amount of color flow AR seen within valve ?  washing jets. The aortic valve has been repaired/replaced. Aortic valve  regurgitation is trivial. No aortic  stenosis is present. Aortic valve mean gradient measures 6.7 mmHg. Aortic  valve peak gradient measures 12.5 mmHg. Aortic valve area, by VTI measures  1.61 cm.   Pulmonic Valve: The pulmonic valve was normal in structure. Pulmonic valve  regurgitation is not visualized. No evidence of pulmonic stenosis.   Aorta: The aortic root is normal in size and structure.   Venous: The inferior vena cava is normal in size with greater than 50%  respiratory variability, suggesting right atrial pressure of 3 mmHg.   IAS/Shunts: No atrial level shunt detected by color flow Doppler.     LEFT VENTRICLE  PLAX 2D  LVIDd:         4.65 cm   Diastology  LVIDs:         3.10 cm   LV e' medial:    6.53 cm/s  LV PW:         0.90 cm   LV E/e' medial:  13.0  LV IVS:        1.10 cm   LV e' lateral:   16.00 cm/s  LVOT diam:     2.00 cm   LV E/e' lateral: 5.3  LV SV:         48  LV SV Index:   28  LVOT Area:     3.14 cm     RIGHT VENTRICLE  RV S prime:     8.16 cm/s  TAPSE (M-mode): 1.1 cm   LEFT ATRIUM             Index        RIGHT ATRIUM           Index  LA diam:        3.20 cm 1.86 cm/m   RA Area:     21.10 cm  LA Vol (A2C):   39.0 ml 22.70 ml/m  RA Volume:   64.80 ml  37.71 ml/m  LA Vol (A4C):   33.9 ml 19.73 ml/m  LA Biplane Vol: 38.8 ml 22.58 ml/m   AORTIC VALVE  AV Area (Vmax):    1.44 cm  AV Area (Vmean):   1.40 cm  AV Area (VTI):     1.61 cm  AV Vmax:           176.67 cm/s  AV Vmean:          115.000 cm/s  AV VTI:            0.299 m  AV Peak Grad:      12.5 mmHg  AV Mean Grad:      6.7 mmHg  LVOT Vmax:         80.90 cm/s  LVOT Vmean:        51.400 cm/s  LVOT VTI:           0.153 m  LVOT/AV VTI ratio: 0.51    AORTA  Ao Root diam: 3.80 cm   MITRAL VALVE               TRICUSPID VALVE  MV Area (PHT): 3.91 cm    TR Peak grad:   10.8 mmHg  MV Decel Time: 194  msec    TR Vmax:        164.00 cm/s  MV E velocity: 84.70 cm/s  MV A velocity: 44.60 cm/s  SHUNTS  MV E/A ratio:  1.90        Systemic VTI:  0.15 m                             Systemic Diam: 2.00 cm   Jenkins Rouge MD  Electronically signed by Jenkins Rouge MD  Signature Date/Time: 02/21/2022/11:55:49 AM        Final (Updated)     Impression:  He is doing well approximately 7 weeks following his surgery.  His postoperative echo looks good.  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  He has returned to driving.  I asked him to avoid doing any heavy lifting of more than 10 pounds for 3 months postoperatively.  His INR is still somewhat subtherapeutic at 1.5.  His Coumadin dose has recently been adjusted upwards by the Coumadin clinic.  After 3 months he can be maintained with an INR of 1.5-2.0 as long as he remains on aspirin 81 mg daily.  Plan:  He will continue to follow-up with cardiology and the Coumadin clinic.  I will plan to see him back in 1 year with a CTA of the chest for aortic surveillance.   Gaye Pollack, MD [facility] [phone]

## 2022-03-07 ENCOUNTER — Ambulatory Visit: Payer: BC Managed Care – PPO | Admitting: Surgery

## 2022-03-13 ENCOUNTER — Ambulatory Visit: Payer: BC Managed Care – PPO | Attending: Cardiology | Admitting: *Deleted

## 2022-03-13 DIAGNOSIS — Z5181 Encounter for therapeutic drug level monitoring: Secondary | ICD-10-CM | POA: Diagnosis not present

## 2022-03-13 DIAGNOSIS — Z952 Presence of prosthetic heart valve: Secondary | ICD-10-CM | POA: Diagnosis not present

## 2022-03-13 LAB — POCT INR: INR: 1.9 — AB (ref 2.0–3.0)

## 2022-03-13 NOTE — Patient Instructions (Signed)
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 (04/17/22) Increase warfarin to 1 tablet daily except 1 1/2 tablets on Tuesdays and Fridays Recheck in 2 wks

## 2022-03-27 ENCOUNTER — Ambulatory Visit: Payer: BC Managed Care – PPO | Attending: Cardiology | Admitting: *Deleted

## 2022-03-27 DIAGNOSIS — Z5181 Encounter for therapeutic drug level monitoring: Secondary | ICD-10-CM

## 2022-03-27 DIAGNOSIS — Z952 Presence of prosthetic heart valve: Secondary | ICD-10-CM | POA: Diagnosis not present

## 2022-03-27 LAB — POCT INR: INR: 1.5 — AB (ref 2.0–3.0)

## 2022-03-27 NOTE — Patient Instructions (Signed)
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 (04/17/22) Take warfarin 2 tablets tonight then increase dose to 1 1/2 tablets daily except 1 tablet on Tuesdays and Fridays Recheck in 1 wk

## 2022-04-03 ENCOUNTER — Ambulatory Visit: Payer: BC Managed Care – PPO | Attending: Cardiology | Admitting: *Deleted

## 2022-04-03 DIAGNOSIS — Z5181 Encounter for therapeutic drug level monitoring: Secondary | ICD-10-CM | POA: Diagnosis not present

## 2022-04-03 DIAGNOSIS — Z952 Presence of prosthetic heart valve: Secondary | ICD-10-CM

## 2022-04-03 LAB — POCT INR: INR: 1.9 — AB (ref 2.0–3.0)

## 2022-04-03 NOTE — Patient Instructions (Signed)
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 (04/17/22) Increase dose to 1 1/2 tablets daily except 1 tablet on Fridays Recheck in 2 wks

## 2022-04-09 ENCOUNTER — Other Ambulatory Visit: Payer: Self-pay | Admitting: *Deleted

## 2022-04-09 MED ORDER — METOPROLOL TARTRATE 25 MG PO TABS: 25.0000 mg | ORAL_TABLET | Freq: Two times a day (BID) | ORAL | 1 refills | Status: DC

## 2022-04-09 MED ORDER — WARFARIN SODIUM 2.5 MG PO TABS: ORAL_TABLET | ORAL | 3 refills | Status: DC

## 2022-04-09 NOTE — Telephone Encounter (Signed)
Refill request for warfarin:  Last INR was 1.9 on 04/03/22 Next INR due 04/17/22 LOV was 01/29/22  Refill approved.

## 2022-04-17 ENCOUNTER — Ambulatory Visit: Payer: BC Managed Care – PPO | Attending: Cardiology | Admitting: *Deleted

## 2022-04-17 DIAGNOSIS — Z952 Presence of prosthetic heart valve: Secondary | ICD-10-CM

## 2022-04-17 DIAGNOSIS — Z5181 Encounter for therapeutic drug level monitoring: Secondary | ICD-10-CM | POA: Diagnosis not present

## 2022-04-17 LAB — POCT INR: INR: 1.9 — AB (ref 2.0–3.0)

## 2022-04-17 NOTE — Patient Instructions (Signed)
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 (04/17/22) Continue warfarin 1 1/2 tablets daily except 1 tablet on Fridays Recheck in 3 wks

## 2022-05-10 ENCOUNTER — Ambulatory Visit: Payer: BC Managed Care – PPO | Attending: Cardiology | Admitting: *Deleted

## 2022-05-10 DIAGNOSIS — Z5181 Encounter for therapeutic drug level monitoring: Secondary | ICD-10-CM

## 2022-05-10 DIAGNOSIS — Z952 Presence of prosthetic heart valve: Secondary | ICD-10-CM | POA: Diagnosis not present

## 2022-05-10 LAB — POCT INR: INR: 2.4 (ref 2.0–3.0)

## 2022-05-10 NOTE — Patient Instructions (Signed)
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 (04/17/22) Take warfarin 1/2 tablet tonight then decrease dose to 1 1/2 tablets daily except 1 tablet on Tuesdays and Fridays Recheck in 3 wks

## 2022-05-28 NOTE — Progress Notes (Deleted)
    Cardiology Office Note  Date: 05/28/2022   ID: Larry Coder., DOB [birth year], MRN [id number]  History of Present Illness: Larry Domond. is a 27 y.o. male last seen in December 2023 by Ms. Larry Calix NP, I reviewed the note.  He is status post Bentall procedure with 26 mm Hemashield Platinum graft and 27/29 mm On-X mechanical valve conduit with reimplantation of coronaries in November 2023 by Dr. Cyndia Bent.  He is on Coumadin with follow-up in the anticoagulation clinic, last INR 2.4 in March.  Physical Exam: VS:  There were no vitals taken for this visit., BMI There is no height or weight on file to calculate BMI.  Wt Readings from Last 3 Encounters:  03/01/22 137 lb (62.1 kg)  02/07/22 133 lb (60.3 kg)  02/05/22 130 lb (59 kg)    General: Patient appears comfortable at rest. HEENT: Conjunctiva and lids normal, oropharynx clear with moist mucosa. Neck: Supple, no elevated JVP or carotid bruits, no thyromegaly. Lungs: Clear to auscultation, nonlabored breathing at rest. Cardiac: Regular rate and rhythm, no S3 or significant systolic murmur, no pericardial rub. Abdomen: Soft, nontender, no hepatomegaly, bowel sounds present, no guarding or rebound. Extremities: No pitting edema, distal pulses 2+. Skin: Warm and dry. Musculoskeletal: No kyphosis. Neuropsychiatric: Alert and oriented x3, affect grossly appropriate.  ECG:  An ECG dated 02/05/2022 was personally reviewed today and demonstrated:  Sinus rhythm with diffuse repolarization abnormalities.  Labwork: 01/09/2022: Magnesium 2.1 02/05/2022: ALT 12; AST 17; BUN 10; Creatinine, Ser 0.99; Hemoglobin 10.9; Platelets 409; Potassium 3.8; Sodium 138   Other Studies Reviewed Today:  Echocardiogram 02/21/2022:  1. Left ventricular ejection fraction, by estimation, is 60 to 65%. The  left ventricle has normal function. The left ventricle has no regional  wall motion abnormalities. Left ventricular diastolic parameters were   normal.   2. Right ventricular systolic function is normal. The right ventricular  size is normal. There is normal pulmonary artery systolic pressure.   3. The mitral valve is normal in structure. No evidence of mitral valve  regurgitation. No evidence of mitral stenosis.   4. Post Bental with AVR 27/29 On-X mechanical valve Normal gradients and  no PVL Small amount of color flow AR seen within valve ? washing jets .  The aortic valve has been repaired/replaced. Aortic valve regurgitation is  trivial. No aortic stenosis is  present.   5. The inferior vena cava is normal in size with greater than 50%  respiratory variability, suggesting right atrial pressure of 3 mmHg.   Assessment and Plan:  Bicuspid aortic valve with moderate aortic stenosis and severe aortic regurgitation now status post Bentall procedure with 26 mm Hemashield Platinum graft and 27/29 mm On-X mechanical valve conduit with reimplantation of coronaries in November 2023 by Dr. Cyndia Bent.  Follow-up echocardiogram from December 2023 as noted above demonstrating normal prosthetic function.  He remains on Coumadin with follow-up in the anticoagulation clinic.  Disposition:  Follow up {follow up:15908}  Signed, Satira Sark, M.D., F.A.C.C. [place] at [facility]

## 2022-05-29 ENCOUNTER — Ambulatory Visit: Payer: BC Managed Care – PPO | Admitting: Cardiology

## 2022-05-29 DIAGNOSIS — Z952 Presence of prosthetic heart valve: Secondary | ICD-10-CM

## 2022-06-19 ENCOUNTER — Ambulatory Visit: Payer: BC Managed Care – PPO | Attending: Cardiology | Admitting: *Deleted

## 2022-06-19 DIAGNOSIS — Z5181 Encounter for therapeutic drug level monitoring: Secondary | ICD-10-CM

## 2022-06-19 DIAGNOSIS — Z952 Presence of prosthetic heart valve: Secondary | ICD-10-CM

## 2022-06-19 LAB — POCT INR: INR: 1.8 — AB (ref 2.0–3.0)

## 2022-06-19 NOTE — Patient Instructions (Signed)
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 (04/17/22) Continue warfarin 1 1/2 tablets daily except 1 tablet on Tuesdays and Fridays Recheck in 3 wks

## 2022-07-26 ENCOUNTER — Ambulatory Visit: Payer: BC Managed Care – PPO | Attending: Cardiology | Admitting: *Deleted

## 2022-07-26 DIAGNOSIS — Z5181 Encounter for therapeutic drug level monitoring: Secondary | ICD-10-CM | POA: Diagnosis not present

## 2022-07-26 DIAGNOSIS — Z952 Presence of prosthetic heart valve: Secondary | ICD-10-CM

## 2022-07-26 LAB — POCT INR: POC INR: 1.4

## 2022-07-26 NOTE — Patient Instructions (Signed)
Description   Mechanical On-X Aortic Valve: Goal 2-3 x 3 months then decrease to 1.5 - 2.0. (04/17/22) Take 2 tablets of warfarin today and then continue warfarin 1 1/2 tablets daily except 1 tablet on Tuesdays and Fridays Recheck in 3 wks

## 2022-07-30 ENCOUNTER — Other Ambulatory Visit: Payer: Self-pay

## 2022-07-30 ENCOUNTER — Encounter: Payer: Self-pay | Admitting: Internal Medicine

## 2022-07-30 ENCOUNTER — Ambulatory Visit: Payer: BC Managed Care – PPO | Admitting: Internal Medicine

## 2022-07-30 VITALS — BP 118/60 | HR 95 | Temp 99.2°F | Resp 16 | Ht 69.75 in | Wt 138.8 lb

## 2022-07-30 DIAGNOSIS — J3089 Other allergic rhinitis: Secondary | ICD-10-CM

## 2022-07-30 DIAGNOSIS — Z79899 Other long term (current) drug therapy: Secondary | ICD-10-CM

## 2022-07-30 DIAGNOSIS — J343 Hypertrophy of nasal turbinates: Secondary | ICD-10-CM | POA: Diagnosis not present

## 2022-07-30 DIAGNOSIS — J301 Allergic rhinitis due to pollen: Secondary | ICD-10-CM

## 2022-07-30 DIAGNOSIS — Q249 Congenital malformation of heart, unspecified: Secondary | ICD-10-CM

## 2022-07-30 MED ORDER — FLUTICASONE PROPIONATE 50 MCG/ACT NA SUSP: 2.0000 | Freq: Every day | NASAL | 5 refills | Status: DC

## 2022-07-30 MED ORDER — AZELASTINE HCL 0.1 % NA SOLN: 2.0000 | Freq: Two times a day (BID) | NASAL | 5 refills | Status: DC

## 2022-07-30 MED ORDER — CETIRIZINE HCL 10 MG PO TABS: 10.0000 mg | ORAL_TABLET | Freq: Every morning | ORAL | 5 refills | Status: DC

## 2022-07-30 NOTE — Patient Instructions (Addendum)
Allergic Rhinitis:  - Positive skin test 07/2022: trees, grasses, weeds, mold, dust mite - Avoidance measures discussed. - Use nasal saline rinses before nose sprays such as with Neilmed Sinus Rinse.  Use distilled water.   - Use Flonase 2 sprays each nostril daily. Aim upward and outward. - Use Azelastine 2 sprays each nostril twice daily. Aim upward and outward. - Use Zyrtec 10 mg daily.  - For eyes, use Olopatadine or Ketotifen 1 eye drop daily as needed for itchy, watery eyes.  Available over the counter, if not covered by insurance.  - With his cardiac history and use of beta blocker, he is not a good candidate for allergy shots due to high risk of having severe reaction if he develops anaphylaxis.

## 2022-07-30 NOTE — Progress Notes (Signed)
NEW PATIENT  Date of Service/Encounter:  07/30/22  Consult requested by: Dion Saucier, PA-C   Subjective:   Larry Campos. (DOB: 27-Mar-1995) is a 27 y.o. male who presents to the clinic on 07/30/2022 with a chief complaint of Allergic Rhinitis  (Says since his heart surgery his allergies have become very bag. Puffy, swollen eyes, sneezing hurts his chest. ) .    History obtained from: chart review and patient.   Rhinitis:  Started since childhood.  Symptoms include: nasal congestion, rhinorrhea, post nasal drainage, sneezing, watery eyes, and itchy eyes sore throat  Occurs seasonally-Spring/Summer  Potential triggers: pollen  Treatments tried:  Zyrtec daily; last use was Thursday Flonase; last use was Wednesday; recently started at the end of April Azelastine; last use was Wednesday; recently started at the end of April  Previous allergy testing: no History of reflux/heartburn: none History of sinus surgery: no Nonallergic triggers: none   Past Medical History: Past Medical History:  Diagnosis Date   Ascending aortic aneurysm (HCC)    s/p repair 12/2021   Congenital heart disease    Congenital bicuspid AV disease, s/p AVR with mechanical valve 12/2021   Seasonal allergies    Unicuspid aortic valve     Past Surgical History: Past Surgical History:  Procedure Laterality Date   2D echo     Echocardiogram done 08/20/19 showed an EF of > 55% withmoderate AI and a AV Mean Grad of 38 mm Hg. b.3/2023aortic stenosis with moderate to severe eccentric aortic regurgitation and EF of 55-60%   BENTALL PROCEDURE N/A 01/08/2022   Procedure: BENTALL PROCEDURE;  Surgeon: [name], MD;  Location: [facility] OR;  Service: Open Heart Surgery;  Laterality: N/A;  CIRC ARREST   I & D EXTREMITY Left 12/06/2018   Procedure:Hand IRRIGATION AND DEBRIDEMENT EXTREMITY;  Surgeon: Dominica Severin, MD;  Location: [facility] OR;  Service: Orthopedics;  Laterality: Left;   RIGHT/LEFT HEART CATH AND  CORONARY ANGIOGRAPHY N/A 12/18/2021   Procedure: RIGHT/LEFT HEART CATH AND CORONARY ANGIOGRAPHY;  Surgeon: Orbie Pyo, MD;  Location: [facility] INVASIVE CV LAB;  Service: Cardiovascular;  Laterality: N/A;   TEE WITHOUT CARDIOVERSION N/A 01/08/2022   Procedure: TRANSESOPHAGEAL ECHOCARDIOGRAM (TEE);  Surgeon: [name], MD;  Location: [facility] OR;  Service: Open Heart Surgery;  Laterality: N/A;   TONSILLECTOMY AND ADENOIDECTOMY  12/07/2010   Procedure: TONSILLECTOMY AND ADENOIDECTOMY;  Surgeon: Darletta Moll;  Location: [facility] ORS;  Service: ENT;  Laterality: Bilateral;    Family History: Family History  Problem Relation Age of Onset   Healthy Mother    Coronary artery disease Father    Allergic rhinitis Sister    Healthy Sister    Healthy Sister    Healthy Brother     Social History:  Lives in a 1935 year house Flooring in bedroom: laminate Pets: dog Tobacco use/exposure: none Job: International aid/development worker  Medication List:  Allergies as of 07/30/2022       Reactions   Amoxicillin Hives, Swelling   Penicillins Hives, Itching, Swelling   Did it involve swelling of the face/tongue/throat, SOB, or low BP? No Did it involve sudden or severe rash/hives, skin peeling, or any reaction on the inside of your mouth or nose? No Did you need to seek medical attention at a hospital or doctor's office? No When did it last happen?       If all above answers are "NO", may proceed with cephalosporin use.        Medication  List        Accurate as of July 30, 2022  4:29 PM. If you have any questions, ask your nurse or doctor.          STOP taking these medications    colchicine 0.6 MG tablet Stopped by: Birder Robson, MD   oxyCODONE 5 MG immediate release tablet Commonly known as: Oxy IR/ROXICODONE Stopped by: Birder Robson, MD   VITAMIN D PO Stopped by: Birder Robson, MD       TAKE these medications    aspirin EC 81 MG tablet Take 1 tablet (81 mg total) by mouth daily. Swallow whole.    azelastine 0.1 % nasal spray Commonly known as: ASTELIN Place 2 sprays into both nostrils 2 (two) times daily. What changed: when to take this Changed by: Birder Robson, MD   cetirizine 10 MG tablet Commonly known as: ZYRTEC Take 1 tablet (10 mg total) by mouth in the morning.   fluticasone 50 MCG/ACT nasal spray Commonly known as: FLONASE Place 2 sprays into both nostrils daily.   metoprolol tartrate 25 MG tablet Commonly known as: LOPRESSOR Take 1 tablet (25 mg total) by mouth 2 (two) times daily.   warfarin 2.5 MG tablet Commonly known as: COUMADIN Take as directed by the anticoagulation clinic. If you are unsure how to take this medication, talk to your nurse or doctor. Original instructions: Take 1 1/2 tablets daily except 1 tablet on Fridays or as directed         REVIEW OF SYSTEMS: Pertinent positives and negatives discussed in HPI.   Objective:   Physical Exam: BP 118/60   Pulse 95   Temp 99.2 F (37.3 C) (Temporal)   Resp 16   Ht 5' 9.75" (1.772 m)   Wt 138 lb 12.8 oz (63 kg)   SpO2 99%   BMI 20.06 kg/m  Body mass index is 20.06 kg/m. GEN: alert, well developed HEENT: clear conjunctiva, TM grey and translucent, nose with + inferior turbinate hypertrophy, pink nasal mucosa, slight clear rhinorrhea, + cobblestoning HEART: regular rate and rhythm, +mechanical click  LUNGS: clear to auscultation bilaterally, no coughing, unlabored respiration ABDOMEN: soft, non distended  SKIN: no rashes or lesions  Reviewed:  03/01/2022: seen by Dr. Laneta Simmers CT surgery s/p reimplantation of coronary arteries and replacement of AAA.  Also on warfarin for mechanical aortic valve with AI.   12/04/2021: seen by Lynnette Caffey MD Cardiology for AI due to bicuspid aortic valve and AAA with dyspnea and chest pain. Plan to do CTA, RHC. Also discussed CT surgery evaluation.    06/21/2022: followed by [facility] with nasal allergies.  On Zyrtec.  Started on Flonase/Azelastine.    Skin Testing:  Skin prick testing was placed, which includes aeroallergens/foods, histamine control, and saline control.  Verbal consent was obtained prior to placing test.  Patient tolerated procedure well.  Allergy testing results were read and interpreted by myself, documented by clinical staff. Adequate positive and negative control.  Results discussed with patient/family.  Airborne Adult Perc - 07/30/22 1502     Time Antigen Placed 1502    Allergen Manufacturer Waynette Buttery    Location Back    Number of Test 55    1. Control-Buffer 50% Glycerol Negative    2. Control-Histamine 3+    3. Bahia 3+    4. [place] 3+    5. Johnson 3+    6. [place] Blue 3+    7. Meadow Fescue 3+  8. Perennial Rye 3+    9. Timothy 3+    10. Ragweed Mix Negative    11. Cocklebur 2+    12. Plantain,  English 3+    13. Baccharis Negative    14. Dog Fennel 2+    15. Guernsey Thistle 2+    16. Lamb's Quarters 3+    17. Sheep Sorrell Negative    18. Rough Pigweed Negative    19. Marsh Elder, Rough 2+    20. Mugwort, Common Negative    21. Box, Elder 3+    22. Cedar, red Negative    23. Sweet Gum 3+    24. Pecan Pollen 3+    25. Pine Mix 3+    26. Walnut, Black Pollen Negative    27. Red Mulberry Negative    28. Ash Mix Negative    29. Birch Mix 3+    30. Beech American 3+    31. Cottonwood, [place] 2+    32. Hickory, White 3+    33. Maple Mix 3+    34. Oak, [place] Mix 3+    35. Sycamore Eastern Negative    36. Alternaria Alternata 2+    37. Cladosporium Herbarum Negative    38. Aspergillus Mix Negative    39. Penicillium Mix Negative    40. Bipolaris Sorokiniana (Helminthosporium) Negative    41. Drechslera Spicifera (Curvularia) Negative    42. Mucor Plumbeus Negative    43. Fusarium Moniliforme Negative    44. Aureobasidium Pullulans (pullulara) Negative    45. Rhizopus Oryzae Negative    46. Botrytis Cinera Negative    47. Epicoccum Nigrum Negative    48. Phoma Betae Negative     49. Dust Mite Mix 3+    50. Cat Hair 10,000 BAU/ml Negative    51.  Dog Epithelia Negative    52. Mixed Feathers Negative    53. Horse Epithelia Negative    54. Cockroach, German Negative    55. Tobacco Leaf Negative               Assessment:   1. Nasal turbinate hypertrophy   2. Current use of beta blocker   3. Congenital heart disease   4. Seasonal allergic rhinitis due to pollen   5. Allergic rhinitis caused by mold   6. Allergic rhinitis due to dust mite     Plan/Recommendations:  Allergic Rhinitis - Due to turbinate hypertrophy, seasonal symptoms and unresponsive to OTC meds, performed skin testing to identify aeroallergen triggers.   - Positive skin test 07/2022: trees, grasses, weeds, mold, dust mite - Avoidance measures discussed. - Use nasal saline rinses before nose sprays such as with Neilmed Sinus Rinse.  Use distilled water.   - Use Flonase 2 sprays each nostril daily. Aim upward and outward. - Use Azelastine 2 sprays each nostril twice daily. Aim upward and outward. - Use Zyrtec 10 mg daily.  - For eyes, use Olopatadine or Ketotifen 1 eye drop daily as needed for itchy, watery eyes.  Available over the counter, if not covered by insurance.  - With his cardiac history and use of beta blocker, he is not a good candidate for allergy shots due to high risk of having severe reaction if he develops anaphylaxis.       Return in about 6 weeks (around 09/10/2022).  Alesia Morin, MD [facility]

## 2022-08-01 ENCOUNTER — Ambulatory Visit: Payer: BC Managed Care – PPO | Attending: Cardiology | Admitting: Cardiology

## 2022-08-01 ENCOUNTER — Encounter: Payer: Self-pay | Admitting: Cardiology

## 2022-08-01 VITALS — BP 122/62 | HR 66 | Ht 69.0 in | Wt 143.4 lb

## 2022-08-01 DIAGNOSIS — Z862 Personal history of diseases of the blood and blood-forming organs and certain disorders involving the immune mechanism: Secondary | ICD-10-CM

## 2022-08-01 DIAGNOSIS — Z952 Presence of prosthetic heart valve: Secondary | ICD-10-CM

## 2022-08-01 NOTE — Patient Instructions (Signed)
Medication Instructions:   Your physician recommends that you continue on your current medications as directed. Please refer to the Current Medication list given to you today.  Labwork: CBC today  Testing/Procedures: None today  Follow-Up: 1 year  Any Other Special Instructions Will Be Listed Below (If Applicable).  If you need a refill on your cardiac medications before your next appointment, please call your pharmacy.

## 2022-08-01 NOTE — Progress Notes (Signed)
Cardiology Office Note  Date: 08/01/2022   ID: Larry Brash., DOB [birth year], MRN [id number]  History of Present Illness: Larry Dubray. is a 27 y.o. male last seen in December 2023 by Ms. Philis Nettle NP, I reviewed the note.  He is here for a routine visit.  He tells me that he has been doing very well, essentially back to baseline in terms of functional capacity, playing basketball, no major limitations.  He has had a lot of trouble with seasonal allergies and is seeing an allergist at this time.  He is on Coumadin with follow-up in anticoagulation clinic.  We discussed the importance of anticoagulation with his mechanical aortic valve, he does not report any spontaneous bleeding problems.  I did review his interval echocardiogram from December 2023, aortic prosthesis was functioning normally at that time.  He did have postoperative anemia, hemoglobin was 10.9 as of December 2023, we discussed getting a follow-up CBC to make sure that things have improved.  Physical Exam: VS:  BP 122/62   Pulse 66   Ht 5\' 9"  (1.753 m)   Wt 143 lb 6.4 oz (65 kg)   SpO2 100%   BMI 21.18 kg/m , BMI Body mass index is 21.18 kg/m.  Wt Readings from Last 3 Encounters:  08/01/22 143 lb 6.4 oz (65 kg)  07/30/22 138 lb 12.8 oz (63 kg)  03/01/22 137 lb (62.1 kg)    General: Patient appears comfortable at rest. HEENT: Conjunctiva and lids normal. Lungs: Clear to auscultation, nonlabored breathing at rest. Cardiac: Regular rate and rhythm, 2/6 systolic murmur with crisp mechanical click in S2. Extremities: No pitting edema.  ECG:  An ECG dated 02/05/2022 was personally reviewed today and demonstrated:  Sinus rhythm with repolarization abnormalities.  Labwork: 01/09/2022: Magnesium 2.1 02/05/2022: ALT 12; AST 17; BUN 10; Creatinine, Ser 0.99; Hemoglobin 10.9; Platelets 409; Potassium 3.8; Sodium 138   Other Studies Reviewed Today:  Echocardiogram 02/21/2022:  1. Left ventricular ejection  fraction, by estimation, is 60 to 65%. The  left ventricle has normal function. The left ventricle has no regional  wall motion abnormalities. Left ventricular diastolic parameters were  normal.   2. Right ventricular systolic function is normal. The right ventricular  size is normal. There is normal pulmonary artery systolic pressure.   3. The mitral valve is normal in structure. No evidence of mitral valve  regurgitation. No evidence of mitral stenosis.   4. Post Bental with AVR 27/29 On-X mechanical valve Normal gradients and  no PVL Small amount of color flow AR seen within valve ? washing jets .  The aortic valve has been repaired/replaced. Aortic valve regurgitation is  trivial. No aortic stenosis is  present.   5. The inferior vena cava is normal in size with greater than 50%  respiratory variability, suggesting right atrial pressure of 3 mmHg.   Assessment and Plan:  1.  History of bicuspid aortic valve with severe aortic regurgitation and moderate aortic stenosis and thoracic aortic aneurysm status post Bentall procedure with 27/29 mm On-X mechanical valve conduit and reimplantation of coronaries with 26 mm Hemashield Platinum ascending aortic graft in November 2023.  Follow-up echocardiogram in December 2023 showed normal prosthetic function with normal gradients.  He remains on Coumadin with regular follow-up in the anticoagulation clinic.  Doing very well at this point and we will continue with annual clinical surveillance for now.  2.  Postoperative anemia, hemoglobin was 10.9 in December 2023.  Recheck CBC.  Disposition:  Follow up  1 year.  Signed, Jonelle Sidle, M.D., F.A.C.C. [facility]

## 2022-08-10 ENCOUNTER — Encounter: Payer: Self-pay | Admitting: Cardiology

## 2022-08-13 ENCOUNTER — Other Ambulatory Visit: Payer: Self-pay | Admitting: *Deleted

## 2022-08-13 MED ORDER — WARFARIN SODIUM 2.5 MG PO TABS: ORAL_TABLET | ORAL | 5 refills | Status: DC

## 2022-08-16 ENCOUNTER — Ambulatory Visit: Payer: BC Managed Care – PPO | Attending: Cardiology | Admitting: *Deleted

## 2022-08-16 DIAGNOSIS — Z5181 Encounter for therapeutic drug level monitoring: Secondary | ICD-10-CM

## 2022-08-16 DIAGNOSIS — Z952 Presence of prosthetic heart valve: Secondary | ICD-10-CM

## 2022-08-16 LAB — POCT INR: INR: 1.6 — AB (ref 2.0–3.0)

## 2022-08-16 NOTE — Patient Instructions (Signed)
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 (04/17/22) Continue warfarin 1 1/2 tablets daily except 1 tablet on Tuesdays and Fridays Recheck in 4 wks

## 2022-09-14 ENCOUNTER — Ambulatory Visit: Payer: BC Managed Care – PPO | Admitting: Family Medicine

## 2022-09-14 VITALS — BP 118/62 | HR 61 | Temp 98.6°F | Resp 16 | Ht 69.0 in | Wt 138.0 lb

## 2022-09-14 DIAGNOSIS — J3089 Other allergic rhinitis: Secondary | ICD-10-CM

## 2022-09-14 DIAGNOSIS — Z79899 Other long term (current) drug therapy: Secondary | ICD-10-CM

## 2022-09-14 DIAGNOSIS — J302 Other seasonal allergic rhinitis: Secondary | ICD-10-CM | POA: Diagnosis not present

## 2022-09-14 DIAGNOSIS — H1013 Acute atopic conjunctivitis, bilateral: Secondary | ICD-10-CM | POA: Diagnosis not present

## 2022-09-14 DIAGNOSIS — H1010 Acute atopic conjunctivitis, unspecified eye: Secondary | ICD-10-CM

## 2022-09-14 NOTE — Progress Notes (Unsigned)
   [address] [place] [address] Dept: [phone]  FOLLOW UP NOTE  Patient ID: Larry Brash., male    DOB: [birth year]  Age: 27 y.o. MRN: [id number] Date of Office Visit: 09/14/2022  Assessment  Chief Complaint: No chief complaint on file.  HPI Larry Brash.    Drug Allergies:  Allergies  Allergen Reactions   Amoxicillin Hives and Swelling   Penicillins Hives, Itching and Swelling    Did it involve swelling of the face/tongue/throat, SOB, or low BP? No Did it involve sudden or severe rash/hives, skin peeling, or any reaction on the inside of your mouth or nose? No Did you need to seek medical attention at a hospital or doctor's office? No When did it last happen?       If all above answers are "NO", may proceed with cephalosporin use.     Physical Exam: There were no vitals taken for this visit.   Physical Exam  Diagnostics:    Assessment and Plan: No diagnosis found.  No orders of the defined types were placed in this encounter.   There are no Patient Instructions on file for this visit.  No follow-ups on file.    Thank you for the opportunity to care for this patient.  Please do not hesitate to contact me with questions.  Thermon Leyland, FNP [facility]

## 2022-09-14 NOTE — Patient Instructions (Addendum)
Allergic rhinitis  Continue allergen avoidance measures directed toward grass pollen, weed pollen, tree pollen, mold, and dust mites as listed below Continue cetirizine 10 mg once a day as needed for a runny nose or itch Begin Ryaltris 2 sprays in each nostril twice a day as needed for nasal symptoms Consider saline nasal rinses as needed for nasal symptoms. Use this before any medicated nasal sprays for best result  Allergic conjunctivitis Continue olopatadine one drop in each eye once a day as needed for red or itchy eyes  Consider a lubrication eye drop as needed. Use this before medicated eye drops for best results  Call the clinic if this treatment plan is not working well for you  Follow up in 6 months or sooner if needed.  Reducing Pollen Exposure The American Academy of All[employer], Asthma and Immunology suggests the following steps to reduce your exposure to pollen during allergy seasons. Do not hang sheets or clothing out to dry; pollen may collect on these items. Do not mow lawns or spend time around freshly cut grass; mowing stirs up pollen. Keep windows closed at night.  Keep car windows closed while driving. Minimize morning activities outdoors, a time when pollen counts are usually at their highest. Stay indoors as much as possible when pollen counts or humidity is high and on windy days when pollen tends to remain in the air longer. Use air conditioning when possible.  Many air conditioners have filters that trap the pollen spores. Use a HEPA room air filter to remove pollen form the indoor air you breathe.  Control of Mold Allergen Mold and fungi can grow on a variety of surfaces provided certain temperature and moisture conditions exist.  Outdoor molds grow on plants, decaying vegetation and soil.  The major outdoor mold, Alternaria and Cladosporium, are found in very high numbers during hot and dry conditions.  Generally, a late Summer - Fall peak is seen for common outdoor  fungal spores.  Rain will temporarily lower outdoor mold spore count, but counts rise rapidly when the rainy period ends.  The most important indoor molds are Aspergillus and Penicillium.  Dark, humid and poorly ventilated basements are ideal sites for mold growth.  The next most common sites of mold growth are the bathroom and the kitchen.  Outdoor [employer] Use air conditioning and keep windows closed Avoid exposure to decaying vegetation. Avoid leaf raking. Avoid grain handling. Consider wearing a face mask if working in moldy areas.  Indoor Mold Control Maintain humidity below 50%. Clean washable surfaces with 5% bleach solution. Remove sources e.g. Contaminated carpets.   Control of Dust Mite Allergen Dust mites play a major role in allergic asthma and rhinitis. They occur in environments with high humidity wherever human skin is found. Dust mites absorb humidity from the atmosphere (ie, they do not drink) and feed on organic matter (including shed human and animal skin). Dust mites are a microscopic type of insect that you cannot see with the naked eye. High levels of dust mites have been detected from mattresses, pillows, carpets, upholstered furniture, bed covers, clothes, soft toys and any woven material. The principal allergen of the dust mite is found in its feces. A gram of dust may contain 1,000 mites and 250,000 fecal particles. Mite antigen is easily measured in the air during house cleaning activities. Dust mites do not bite and do not cause harm to humans, other than by triggering allergies/asthma.  Ways to decrease your exposure to dust mites in  your home:  1. Encase mattresses, box springs and pillows with a mite-impermeable barrier or cover  2. Wash sheets, blankets and drapes weekly in hot water (130 F) with detergent and dry them in a dryer on the hot setting.  3. Have the room cleaned frequently with a vacuum cleaner and a damp dust-mop. For carpeting or rugs,  vacuuming with a vacuum cleaner equipped with a high-efficiency particulate air (HEPA) filter. The dust mite allergic individual should not be in a room which is being cleaned and should wait 1 hour after cleaning before going into the room.  4. Do not sleep on upholstered furniture (eg, couches).  5. If possible removing carpeting, upholstered furniture and drapery from the home is ideal. Horizontal blinds should be eliminated in the rooms where the person spends the most time (bedroom, study, television room). Washable vinyl, roller-type shades are optimal.  6. Remove all non-washable stuffed toys from the bedroom. Wash stuffed toys weekly like sheets and blankets above.  7. Reduce indoor humidity to less than 50%. Inexpensive humidity monitors can be purchased at most hardware stores. Do not use a humidifier as can make the problem worse and are not recommended.

## 2022-09-16 ENCOUNTER — Encounter: Payer: Self-pay | Admitting: Family Medicine

## 2022-09-16 DIAGNOSIS — Z79899 Other long term (current) drug therapy: Secondary | ICD-10-CM | POA: Insufficient documentation

## 2022-09-16 DIAGNOSIS — J302 Other seasonal allergic rhinitis: Secondary | ICD-10-CM | POA: Insufficient documentation

## 2022-09-16 DIAGNOSIS — H1010 Acute atopic conjunctivitis, unspecified eye: Secondary | ICD-10-CM | POA: Insufficient documentation

## 2022-09-17 ENCOUNTER — Telehealth: Payer: Self-pay | Admitting: *Deleted

## 2022-09-17 NOTE — Telephone Encounter (Signed)
Can you please order montelukast 10 mg once a day. Please have him take montelukast and cetirizine daily? Thank you

## 2022-09-17 NOTE — Telephone Encounter (Signed)
Called and spoke with patient, he stated that he has taken Singulair for his symptoms and found it to not be as helpful as zyrtec.

## 2022-09-17 NOTE — Telephone Encounter (Signed)
-----   Message from Thermon Leyland sent at 09/16/2022  2:43 PM EDT ----- Can you please call this patient and ask if he has taken montelukast for allergy symptoms before? Thank you

## 2022-09-18 ENCOUNTER — Other Ambulatory Visit: Payer: Self-pay | Admitting: *Deleted

## 2022-09-18 MED ORDER — MONTELUKAST SODIUM 10 MG PO TABS: 10.0000 mg | ORAL_TABLET | Freq: Every day | ORAL | 5 refills | Status: DC

## 2022-09-18 NOTE — Telephone Encounter (Signed)
Medication has been sent in. Called patient and advised. Patient verbalized understanding.  

## 2022-09-25 ENCOUNTER — Ambulatory Visit: Payer: BC Managed Care – PPO | Attending: Cardiology | Admitting: *Deleted

## 2022-09-25 DIAGNOSIS — Z5181 Encounter for therapeutic drug level monitoring: Secondary | ICD-10-CM | POA: Diagnosis not present

## 2022-09-25 DIAGNOSIS — Z952 Presence of prosthetic heart valve: Secondary | ICD-10-CM

## 2022-09-25 LAB — POCT INR: INR: 2.1 (ref 2.0–3.0)

## 2022-09-25 NOTE — Patient Instructions (Signed)
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 (04/17/22) Continue warfarin 1 1/2 tablets daily except 1 tablet on Tuesdays and Fridays Recheck in 4 wks

## 2022-10-23 ENCOUNTER — Ambulatory Visit: Payer: BC Managed Care – PPO | Attending: Cardiology | Admitting: *Deleted

## 2022-10-23 DIAGNOSIS — Z5181 Encounter for therapeutic drug level monitoring: Secondary | ICD-10-CM | POA: Diagnosis not present

## 2022-10-23 DIAGNOSIS — Z952 Presence of prosthetic heart valve: Secondary | ICD-10-CM

## 2022-10-23 LAB — POCT INR: INR: 1.7 — AB (ref 2.0–3.0)

## 2022-10-23 NOTE — Patient Instructions (Signed)
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 (04/17/22) Continue warfarin 1 1/2 tablets daily except 1 tablet on Tuesdays and Fridays Recheck in 5 wks

## 2022-11-27 ENCOUNTER — Ambulatory Visit: Payer: BC Managed Care – PPO | Attending: Cardiology | Admitting: *Deleted

## 2022-11-27 DIAGNOSIS — Z952 Presence of prosthetic heart valve: Secondary | ICD-10-CM

## 2022-11-27 DIAGNOSIS — Z5181 Encounter for therapeutic drug level monitoring: Secondary | ICD-10-CM

## 2022-11-27 LAB — POCT INR: INR: 1.9 — AB (ref 2.0–3.0)

## 2022-11-27 NOTE — Patient Instructions (Signed)
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 (04/17/22) Continue warfarin 1 1/2 tablets daily except 1 tablet on Tuesdays and Fridays Recheck in 6 wks

## 2023-01-08 ENCOUNTER — Ambulatory Visit: Payer: BC Managed Care – PPO | Attending: Cardiology | Admitting: *Deleted

## 2023-01-08 DIAGNOSIS — Z5181 Encounter for therapeutic drug level monitoring: Secondary | ICD-10-CM | POA: Diagnosis not present

## 2023-01-08 DIAGNOSIS — Z952 Presence of prosthetic heart valve: Secondary | ICD-10-CM

## 2023-01-08 LAB — POCT INR: INR: 2.1 (ref 2.0–3.0)

## 2023-01-08 NOTE — Patient Instructions (Signed)
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 (04/17/22) Continue warfarin 1 1/2 tablets daily except 1 tablet on Tuesdays and Fridays Recheck in 6 wks

## 2023-01-13 ENCOUNTER — Other Ambulatory Visit: Payer: Self-pay | Admitting: Internal Medicine

## 2023-01-15 ENCOUNTER — Encounter: Payer: Self-pay | Admitting: Surgery

## 2023-01-15 ENCOUNTER — Telehealth: Payer: Self-pay

## 2023-01-15 NOTE — Telephone Encounter (Signed)
Called patient back about message sent through mychart. Advised per Fredric Mare, [place], photo sent looks to be expected. Patient may have turned head too fast, broke up scar tissue which in turn caused pain/ swelling. Advised that if patient experienced continued pain/swelling or worsening of symptoms he is to contact the office back. He acknowledged receipt.

## 2023-02-04 ENCOUNTER — Telehealth: Payer: Self-pay | Admitting: *Deleted

## 2023-02-04 ENCOUNTER — Other Ambulatory Visit: Payer: Self-pay | Admitting: Surgery

## 2023-02-04 ENCOUNTER — Other Ambulatory Visit: Payer: Self-pay | Admitting: Thoracic Surgery (Cardiothoracic Vascular Surgery)

## 2023-02-04 DIAGNOSIS — I7121 Aneurysm of the ascending aorta, without rupture: Secondary | ICD-10-CM

## 2023-02-04 DIAGNOSIS — Z952 Presence of prosthetic heart valve: Secondary | ICD-10-CM

## 2023-02-04 NOTE — Telephone Encounter (Signed)
Patient contacted the office regarding swelling at top of his sternal incision that comes and goes. States it causes pain when turning his head. Photos sent via MyChart account. Per Dr. Laneta Simmers, CT with contrast scheduled with follow up visit in the office. Patient verbalizes understanding.

## 2023-02-08 ENCOUNTER — Encounter: Payer: Self-pay | Admitting: Surgery

## 2023-02-18 ENCOUNTER — Ambulatory Visit
Admission: RE | Admit: 2023-02-18 | Discharge: 2023-02-18 | Disposition: A | Discharge status: Home / Self Care | Payer: BC Managed Care – PPO | Source: Ambulatory Visit | Attending: Surgery | Admitting: Surgery

## 2023-02-18 DIAGNOSIS — Z952 Presence of prosthetic heart valve: Secondary | ICD-10-CM

## 2023-02-18 MED ORDER — IOPAMIDOL (ISOVUE-300) INJECTION 61%
500.0000 mL | Freq: Once | INTRAVENOUS | Status: AC | PRN
  Administered 2023-02-18: 75 mL via INTRAVENOUS

## 2023-02-28 ENCOUNTER — Ambulatory Visit: Payer: BC Managed Care – PPO | Admitting: Surgery

## 2023-02-28 ENCOUNTER — Encounter: Payer: Self-pay | Admitting: Surgery

## 2023-02-28 VITALS — BP 137/74 | HR 67 | Resp 18 | Ht 69.0 in | Wt 150.0 lb

## 2023-02-28 DIAGNOSIS — Z09 Encounter for follow-up examination after completed treatment for conditions other than malignant neoplasm: Secondary | ICD-10-CM | POA: Diagnosis not present

## 2023-02-28 NOTE — Progress Notes (Signed)
 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  He had an uncomplicated postoperative course but then returned with some left-sided pleuritic chest pain and pleural effusion consistent with Dressler syndrome.  He was treated with colchicine  for 14 days and a prednisone  taper with resolution.  Since then he has continued to do well and is on Coumadin  with an INR of 2.1 on 01/08/2023.  In November he called our office reporting development of a painful swelling over the upper portion of his sternum.  He sent us  some pictures of it through MyChart.  He was scheduled for a follow-up visit for 1 year CT scan to reevaluate the remainder of his aorta.  He said that since the development of that swelling it has gradually resolved.  He has had no fever or chills.  There is no pain in the area.  Current Outpatient Medications  Medication Sig Dispense Refill   aspirin  EC 81 MG tablet Take 1 tablet (81 mg total) by mouth daily. Swallow whole. 30 tablet 12   azelastine  (ASTELIN ) 0.1 % nasal spray Place 2 sprays into both nostrils 2 (two) times daily. 30 mL 5   cetirizine  (ZYRTEC ) 10 MG tablet TAKE 1 TABLET BY MOUTH DAILY IN THE MORNING 30 tablet 5   fluticasone  (FLONASE ) 50 MCG/ACT nasal spray Place 2 sprays into both nostrils daily. 16 g 5   metoprolol  tartrate (LOPRESSOR ) 25 MG tablet Take 1 tablet (25 mg total) by mouth 2 (two) times daily. 180 tablet 1   montelukast  (SINGULAIR ) 10 MG tablet Take 1 tablet (10 mg total) by mouth at bedtime. 30 tablet 5   warfarin (COUMADIN ) 2.5 MG tablet Take 1 1/2 tablets daily or as directed by Coumadin  Clinic 50 tablet 5   No current facility-administered medications for this visit.      Physical Exam: BP 137/74   Pulse 67   Resp 18   Ht 5' 9 (1.753 m)   Wt 150 lb (68 kg)   SpO2 99% Comment: RA  BMI 22.15 kg/m  He looks well. Cardiac exam shows a regular rate and rhythm with a crisp mechanical valve click.  There is no murmur. The chest incision is well-healed.  There is no swelling along the incision but I cannot feel the sternal wires particularly over the manubrium.  When he turns his head I can feel some underlying soft tissue moving over the wire.  The sternum feels stable.  Diagnostic Tests:  The official radiology report of his CT scan of the chest done on 02/18/2023 is still pending.  I have personally reviewed the CT scan and the sternum is well-healed.  The sternal wires do protrude because he has very little subcutaneous tissue.  There is no sign of subcutaneous swelling or mass.  The ascending aortic graft looks normal.  There is no sign of pseudoaneurysm formation at any of the anastomoses.  The remainder of his aortic arch and descending thoracic aorta are normal size.  Impression:  I think he most likely developed a subcutaneous hematoma related to local trauma from soft tissue moving over the sternal wire.  This is probably more likely to occur since he is on Coumadin  and has very little soft tissue there.  It has completely resolved  but certainly may occur again.  I think the best option is to remove the upper sternal wires to prevent this from happening again and to prevent any discomfort with turning his head.  I reviewed the CT images with him and answered all of his questions.  Plan:  He is going to discuss this with his wife and will decide if he wants to proceed with removal of the manubrial sternal wires.  If he decides to proceed with that it can be done as an outpatient and I do not think he needs to be off Coumadin .  I spent 10 minutes performing this established patient evaluation and > 50% of this time was spent face to face counseling  and coordinating the surveillance of his previously resected aortic aneurysm and evaluation of a recent swollen mass over the manubrium.   Dorise MARLA Fellers, MD [facility] [phone]

## 2023-03-01 ENCOUNTER — Ambulatory Visit: Payer: BC Managed Care – PPO | Attending: Cardiology | Admitting: *Deleted

## 2023-03-01 DIAGNOSIS — Z952 Presence of prosthetic heart valve: Secondary | ICD-10-CM

## 2023-03-01 DIAGNOSIS — Z5181 Encounter for therapeutic drug level monitoring: Secondary | ICD-10-CM | POA: Diagnosis not present

## 2023-03-01 LAB — POCT INR: INR: 1.7 — AB (ref 2.0–3.0)

## 2023-03-01 NOTE — Patient Instructions (Signed)
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 (04/17/22) Continue warfarin 1 1/2 tablets daily except 1 tablet on Tuesdays and Fridays Recheck in 6 wks

## 2023-03-13 ENCOUNTER — Ambulatory Visit: Payer: BC Managed Care – PPO | Admitting: Surgery

## 2023-03-21 ENCOUNTER — Other Ambulatory Visit: Payer: Self-pay | Admitting: Family Medicine

## 2023-04-04 ENCOUNTER — Encounter: Payer: Self-pay | Admitting: Surgery

## 2023-04-05 ENCOUNTER — Other Ambulatory Visit: Payer: Self-pay | Admitting: *Deleted

## 2023-04-05 DIAGNOSIS — R0789 Other chest pain: Secondary | ICD-10-CM

## 2023-04-15 ENCOUNTER — Ambulatory Visit: Payer: BC Managed Care – PPO | Attending: Cardiology | Admitting: *Deleted

## 2023-04-15 DIAGNOSIS — Z5181 Encounter for therapeutic drug level monitoring: Secondary | ICD-10-CM

## 2023-04-15 DIAGNOSIS — Z952 Presence of prosthetic heart valve: Secondary | ICD-10-CM | POA: Diagnosis not present

## 2023-04-15 LAB — POCT INR: INR: 1.8 — AB (ref 2.0–3.0)

## 2023-04-15 MED ORDER — WARFARIN SODIUM 2.5 MG PO TABS: ORAL_TABLET | ORAL | 5 refills | Status: DC

## 2023-04-15 NOTE — Patient Instructions (Signed)
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 (04/17/22) Continue warfarin 1 1/2 tablets daily except 1 tablet on Tuesdays and Fridays Recheck in 6 wks

## 2023-04-23 ENCOUNTER — Other Ambulatory Visit: Payer: Self-pay

## 2023-04-23 ENCOUNTER — Encounter (HOSPITAL_COMMUNITY): Payer: Self-pay | Admitting: Surgery

## 2023-04-23 NOTE — Progress Notes (Incomplete)
 PCP - Lorie Phenix PA-C [facility]  Cardiologist - Jonelle Sidle, MD   PPM/ICD - denies Device Orders - n/a Rep Notified - n/a  Chest x-ray -  EKG - DOS Stress Test -  ECHO - 02-21-22 Cardiac Cath -   CPAP - denies   Blood Thinner Instructions: Coumadin patient to continue  Aspirin Instructions: patient is to continue  ERAS Protcol - clear liquids until 10:00  COVID TEST- n/a  Anesthesia review: no  Patient verbally denies any shortness of breath, fever, cough and chest pain during phone call   -------------  SDW INSTRUCTIONS given:  Your procedure is scheduled on April 26, 2023.  Report to [facility] Main Entrance "A" at 10:30 A.M., and check in at the Admitting office.  Call this number if you have problems the morning of surgery:  [phone]   Remember:  Do not eat after midnight the night before your surgery  You may drink clear liquids until 10:00 the morning of your surgery.   Clear liquids allowed are: Water, Non-Citrus Juices (without pulp), Carbonated Beverages, Clear Tea, Black Coffee Only, and Gatorade    Take these medicines the morning of surgery with A SIP OF WATER  cetirizine (ZYRTEC)  fluticasone (FLONASE)  metoprolol tartrate (LOPRESSOR)    As of today, STOP taking any Aspirin (unless otherwise instructed by your surgeon) Aleve, Naproxen, Ibuprofen, Motrin, Advil, Goody's, BC's, all herbal medications, fish oil, and all vitamins.                      Do not wear jewelry, make up, or nail polish            Do not wear lotions, powders, perfumes/colognes, or deodorant.            Do not shave 48 hours prior to surgery.  Men may shave face and neck.            Do not bring valuables to the hospital.            [facility] is not responsible for any belongings or valuables.  Do NOT Smoke (Tobacco/Vaping) 24 hours prior to your procedure If you use a CPAP at night, you may bring all equipment for your overnight stay.    Contacts, glasses, dentures or bridgework may not be worn into surgery.      For patients admitted to the hospital, discharge time will be determined by your treatment team.   Patients discharged the day of surgery will not be allowed to drive home, and someone needs to stay with them for 24 hours.    Special instructions:   [facility]- Preparing For Surgery  Before surgery, you can play an important role. Because skin is not sterile, your skin needs to be as free of germs as possible. You can reduce the number of germs on your skin by washing with CHG (chlorahexidine gluconate) Soap before surgery.  CHG is an antiseptic cleaner which kills germs and bonds with the skin to continue killing germs even after washing.    Oral Hygiene is also important to reduce your risk of infection.  Remember - BRUSH YOUR TEETH THE MORNING OF SURGERY WITH YOUR REGULAR TOOTHPASTE  Please do not use if you have an allergy to CHG or antibacterial soaps. If your skin becomes reddened/irritated stop using the CHG.  Do not shave (including legs and underarms) for at least 48 hours prior to first CHG shower. It is OK  to shave your face.  Please follow these instructions carefully.   Shower the NIGHT BEFORE SURGERY and the MORNING OF SURGERY with DIAL Soap.   Pat yourself dry with a CLEAN TOWEL.  Wear CLEAN PAJAMAS to bed the night before surgery  Place CLEAN SHEETS on your bed the night of your first shower and DO NOT SLEEP WITH PETS.   Day of Surgery: Please shower morning of surgery  Wear Clean/Comfortable clothing the morning of surgery Do not apply any deodorants/lotions.   Remember to brush your teeth WITH YOUR REGULAR TOOTHPASTE.   Questions were answered. Patient verbalized understanding of instructions.

## 2023-04-23 NOTE — Patient Instructions (Incomplete)
 Allergic rhinitis  -Continue allergen avoidance measures directed toward grass pollen, weed pollen, tree pollen, mold, and dust mites as listed below -Continue cetirizine 10 mg once a day as needed for a runny nose or itch -Continue Singulair (montelukast) 10 mg daily -Continue  Ryaltris 2 sprays in each nostril twice a day as needed for nasal symptoms. This will be sent to a specialty pharmacy -Consider saline nasal rinses as needed for nasal symptoms. Use this before any medicated nasal sprays for best result - With his cardiac history and use of beta blocker, he is not a good candidate for allergy shots due to high risk of having severe reaction if he develops anaphylaxis.   Allergic conjunctivitis Continue olopatadine one drop in each eye once a day as needed for red or itchy eyes  Consider a lubrication eye drop as needed. Use this before medicated eye drops for best results  Call the clinic if this treatment plan is not working well for you  Follow up in 6-12 months or sooner if needed.  Reducing Pollen Exposure The American Academy of Allergy, Asthma and Immunology suggests the following steps to reduce your exposure to pollen during allergy seasons. Do not hang sheets or clothing out to dry; pollen may collect on these items. Do not mow lawns or spend time around freshly cut grass; mowing stirs up pollen. Keep windows closed at night.  Keep car windows closed while driving. Minimize morning activities outdoors, a time when pollen counts are usually at their highest. Stay indoors as much as possible when pollen counts or humidity is high and on windy days when pollen tends to remain in the air longer. Use air conditioning when possible.  Many air conditioners have filters that trap the pollen spores. Use a HEPA room air filter to remove pollen form the indoor air you breathe.  Control of Mold Allergen Mold and fungi can grow on a variety of surfaces provided certain temperature and  moisture conditions exist.  Outdoor molds grow on plants, decaying vegetation and soil.  The major outdoor mold, Alternaria and Cladosporium, are found in very high numbers during hot and dry conditions.  Generally, a late Summer - Fall peak is seen for common outdoor fungal spores.  Rain will temporarily lower outdoor mold spore count, but counts rise rapidly when the rainy period ends.  The most important indoor molds are Aspergillus and Penicillium.  Dark, humid and poorly ventilated basements are ideal sites for mold growth.  The next most common sites of mold growth are the bathroom and the kitchen.  Outdoor [employer] Use air conditioning and keep windows closed Avoid exposure to decaying vegetation. Avoid leaf raking. Avoid grain handling. Consider wearing a face mask if working in moldy areas.  Indoor Mold Control Maintain humidity below 50%. Clean washable surfaces with 5% bleach solution. Remove sources e.g. Contaminated carpets.   Control of Dust Mite Allergen Dust mites play a major role in allergic asthma and rhinitis. They occur in environments with high humidity wherever human skin is found. Dust mites absorb humidity from the atmosphere (ie, they do not drink) and feed on organic matter (including shed human and animal skin). Dust mites are a microscopic type of insect that you cannot see with the naked eye. High levels of dust mites have been detected from mattresses, pillows, carpets, upholstered furniture, bed covers, clothes, soft toys and any woven material. The principal allergen of the dust mite is found in its feces. A gram of dust  may contain 1,000 mites and 250,000 fecal particles. Mite antigen is easily measured in the air during house cleaning activities. Dust mites do not bite and do not cause harm to humans, other than by triggering allergies/asthma.  Ways to decrease your exposure to dust mites in your home:  1. Encase mattresses, box springs and pillows with a  mite-impermeable barrier or cover  2. Wash sheets, blankets and drapes weekly in hot water (130 F) with detergent and dry them in a dryer on the hot setting.  3. Have the room cleaned frequently with a vacuum cleaner and a damp dust-mop. For carpeting or rugs, vacuuming with a vacuum cleaner equipped with a high-efficiency particulate air (HEPA) filter. The dust mite allergic individual should not be in a room which is being cleaned and should wait 1 hour after cleaning before going into the room.  4. Do not sleep on upholstered furniture (eg, couches).  5. If possible removing carpeting, upholstered furniture and drapery from the home is ideal. Horizontal blinds should be eliminated in the rooms where the person spends the most time (bedroom, study, television room). Washable vinyl, roller-type shades are optimal.  6. Remove all non-washable stuffed toys from the bedroom. Wash stuffed toys weekly like sheets and blankets above.  7. Reduce indoor humidity to less than 50%. Inexpensive humidity monitors can be purchased at most hardware stores. Do not use a humidifier as can make the problem worse and are not recommended.

## 2023-04-24 ENCOUNTER — Encounter: Payer: Self-pay | Admitting: Family

## 2023-04-24 ENCOUNTER — Ambulatory Visit: Payer: BC Managed Care – PPO | Admitting: Family

## 2023-04-24 VITALS — BP 130/78 | HR 68 | Temp 98.0°F | Resp 18

## 2023-04-24 DIAGNOSIS — H1013 Acute atopic conjunctivitis, bilateral: Secondary | ICD-10-CM

## 2023-04-24 DIAGNOSIS — Z79899 Other long term (current) drug therapy: Secondary | ICD-10-CM | POA: Diagnosis not present

## 2023-04-24 DIAGNOSIS — J3089 Other allergic rhinitis: Secondary | ICD-10-CM | POA: Diagnosis not present

## 2023-04-24 DIAGNOSIS — J302 Other seasonal allergic rhinitis: Secondary | ICD-10-CM | POA: Diagnosis not present

## 2023-04-24 DIAGNOSIS — H1010 Acute atopic conjunctivitis, unspecified eye: Secondary | ICD-10-CM

## 2023-04-24 MED ORDER — RYALTRIS 665-25 MCG/ACT NA SUSP: NASAL | 5 refills | Status: AC

## 2023-04-24 MED ORDER — MONTELUKAST SODIUM 10 MG PO TABS: 10.0000 mg | ORAL_TABLET | Freq: Every day | ORAL | 5 refills | Status: DC

## 2023-04-24 MED ORDER — OLOPATADINE HCL 0.2 % OP SOLN: OPHTHALMIC | 5 refills | Status: AC

## 2023-04-24 MED ORDER — CETIRIZINE HCL 10 MG PO TABS: 10.0000 mg | ORAL_TABLET | Freq: Every morning | ORAL | 5 refills | Status: DC

## 2023-04-24 NOTE — Progress Notes (Signed)
 [address] [place] [address] Dept: [phone]  FOLLOW UP NOTE  Patient ID: Larry Campos., male    DOB: 25-Feb-1996  Age: 28 y.o. MRN: [id number] Date of Office Visit: 04/24/2023  Assessment  Chief Complaint: Allergic Rhinitis   HPI Larry Campos. is a 28 year old male who presents today for follow-up of seasonal and perennial allergic rhinitis, allergic conjunctivitis, and current use of beta-blocker.  He denies any new diagnosis or surgery since his last office visit.  He does have an upcoming surgery to remove over wire from his chest.  He reports he is getting the wire removed due to previously having open heart surgery due to aortic stenosis and getting a mechanical valve.  Allergic rhinitis: He reports as long as he is taking Singulair his allergies are great.  He also continues to take cetirizine 10 mg daily and only uses Ryaltris  nasal spray as needed.  When he ran out of his Singulair he had runny nose and constant sneezing that hurt his chest.  He has not been treated for any sinus infections since we last saw him.  Allergic conjunctivitis: He reports itchy watery eyes once in a blue moon if he is outside for a long period of time.  He does not have any eyedrops to help with itchy eyes.   Drug Allergies:  Allergies  Allergen Reactions   Amoxicillin Hives and Swelling   Penicillins Hives, Itching and Swelling         Review of Systems: Negative except as per HPI   Physical Exam: BP 130/78 (BP Location: Right Arm, Patient Position: Sitting, Cuff Size: Normal)   Pulse 68   Temp 98 F (36.7 C) (Temporal)   Resp 18   SpO2 99%    Physical Exam Constitutional:      Appearance: Normal appearance.  HENT:     Head: Normocephalic and atraumatic.     Comments: Pharynx normal, eyes normal, ears normal, nose normal    Right Ear: Tympanic membrane, ear canal and external ear normal.     Left Ear: Tympanic membrane, ear canal and external ear normal.      Nose: Nose normal.     Mouth/Throat:     Mouth: Mucous membranes are moist.     Pharynx: Oropharynx is clear.  Eyes:     Conjunctiva/sclera: Conjunctivae normal.  Cardiovascular:     Rate and Rhythm: Normal rate and regular rhythm.     Comments: Mechanical click Pulmonary:     Effort: Pulmonary effort is normal.     Breath sounds: Normal breath sounds.     Comments: Lungs clear to auscltation Musculoskeletal:     Cervical back: Neck supple.  Skin:    General: Skin is warm.  Neurological:     Mental Status: He is alert and oriented to person, place, and time.  Psychiatric:        Mood and Affect: Mood normal.        Behavior: Behavior normal.        Thought Content: Thought content normal.        Judgment: Judgment normal.     Diagnostics:  None  Assessment and Plan: 1. Seasonal and perennial allergic rhinitis   2. Seasonal allergic conjunctivitis   3. Current use of beta blocker     Meds ordered this encounter  Medications   montelukast (SINGULAIR) 10 MG tablet    Sig: Take 1 tablet (10 mg total) by mouth at bedtime.  Dispense:  30 tablet    Refill:  5   cetirizine (ZYRTEC) 10 MG tablet    Sig: Take 1 tablet (10 mg total) by mouth every morning.    Dispense:  30 tablet    Refill:  5   Olopatadine-Mometasone (RYALTRIS) 665-25 MCG/ACT SUSP    Sig: Use 2 sprays in each nostril twice a day as needed for stuffy/runny nose    Dispense:  29 g    Refill:  5    [phone]   Olopatadine HCl 0.2 % SOLN    Sig: Place 1 drop in each eye once a day as needed for itchy watery eyes    Dispense:  2.5 mL    Refill:  5    Patient Instructions  Allergic rhinitis  -Continue allergen avoidance measures directed toward grass pollen, weed pollen, tree pollen, mold, and dust mites as listed below -Continue cetirizine 10 mg once a day as needed for a runny nose or itch -Continue Singulair (montelukast) 10 mg daily -Continue  Ryaltris 2 sprays in each nostril twice a day as  needed for nasal symptoms. This will be sent to a specialty pharmacy -Consider saline nasal rinses as needed for nasal symptoms. Use this before any medicated nasal sprays for best result - With his cardiac history and use of beta blocker, he is not a good candidate for allergy shots due to high risk of having severe reaction if he develops anaphylaxis.   Allergic conjunctivitis Continue olopatadine one drop in each eye once a day as needed for red or itchy eyes  Consider a lubrication eye drop as needed. Use this before medicated eye drops for best results  Call the clinic if this treatment plan is not working well for you  Follow up in 6-12 months or sooner if needed.  Reducing Pollen Exposure The American Academy of Allergy, Asthma and Immunology suggests the following steps to reduce your exposure to pollen during allergy seasons. Do not hang sheets or clothing out to dry; pollen may collect on these items. Do not mow lawns or spend time around freshly cut grass; mowing stirs up pollen. Keep windows closed at night.  Keep car windows closed while driving. Minimize morning activities outdoors, a time when pollen counts are usually at their highest. Stay indoors as much as possible when pollen counts or humidity is high and on windy days when pollen tends to remain in the air longer. Use air conditioning when possible.  Many air conditioners have filters that trap the pollen spores. Use a HEPA room air filter to remove pollen form the indoor air you breathe.  Control of Mold Allergen Mold and fungi can grow on a variety of surfaces provided certain temperature and moisture conditions exist.  Outdoor molds grow on plants, decaying vegetation and soil.  The major outdoor mold, Alternaria and Cladosporium, are found in very high numbers during hot and dry conditions.  Generally, a late Summer - Fall peak is seen for common outdoor fungal spores.  Rain will temporarily lower outdoor mold spore  count, but counts rise rapidly when the rainy period ends.  The most important indoor molds are Aspergillus and Penicillium.  Dark, humid and poorly ventilated basements are ideal sites for mold growth.  The next most common sites of mold growth are the bathroom and the kitchen.  Outdoor [employer] Use air conditioning and keep windows closed Avoid exposure to decaying vegetation. Avoid leaf raking. Avoid grain handling. Consider wearing a face mask if  working in [employer] areas.  Indoor Mold Control Maintain humidity below 50%. Clean washable surfaces with 5% bleach solution. Remove sources e.g. Contaminated carpets.   Control of Dust Mite Allergen Dust mites play a major role in allergic asthma and rhinitis. They occur in environments with high humidity wherever human skin is found. Dust mites absorb humidity from the atmosphere (ie, they do not drink) and feed on organic matter (including shed human and animal skin). Dust mites are a microscopic type of insect that you cannot see with the naked eye. High levels of dust mites have been detected from mattresses, pillows, carpets, upholstered furniture, bed covers, clothes, soft toys and any woven material. The principal allergen of the dust mite is found in its feces. A gram of dust may contain 1,000 mites and 250,000 fecal particles. Mite antigen is easily measured in the air during house cleaning activities. Dust mites do not bite and do not cause harm to humans, other than by triggering allergies/asthma.  Ways to decrease your exposure to dust mites in your home:  1. Encase mattresses, box springs and pillows with a mite-impermeable barrier or cover  2. Wash sheets, blankets and drapes weekly in hot water (130 F) with detergent and dry them in a dryer on the hot setting.  3. Have the room cleaned frequently with a vacuum cleaner and a damp dust-mop. For carpeting or rugs, vacuuming with a vacuum cleaner equipped with a high-efficiency  particulate air (HEPA) filter. The dust mite allergic individual should not be in a room which is being cleaned and should wait 1 hour after cleaning before going into the room.  4. Do not sleep on upholstered furniture (eg, couches).  5. If possible removing carpeting, upholstered furniture and drapery from the home is ideal. Horizontal blinds should be eliminated in the rooms where the person spends the most time (bedroom, study, television room). Washable vinyl, roller-type shades are optimal.  6. Remove all non-washable stuffed toys from the bedroom. Wash stuffed toys weekly like sheets and blankets above.  7. Reduce indoor humidity to less than 50%. Inexpensive humidity monitors can be purchased at most hardware stores. Do not use a humidifier as can make the problem worse and are not recommended.      Return in about 1 year (around 04/23/2024), or if symptoms worsen or fail to improve.    Thank you for the opportunity to care for this patient.  Please do not hesitate to contact me with questions.  Nehemiah Settle, FNP [facility]

## 2023-04-24 NOTE — Progress Notes (Addendum)
 Spoke with Alycia Rossetti at Dr. Laneta Simmers office regarding instructions for Coumadin and Jonne Ply patient is to continue with both medication. Patient will need blood work on [employer] already ordered.  Per Alycia Rossetti patient will not need CXR or EKG DOS. Patient is aware of above instructions

## 2023-04-26 ENCOUNTER — Ambulatory Visit (HOSPITAL_COMMUNITY): Payer: BC Managed Care – PPO | Admitting: Certified Registered Nurse Anesthetist

## 2023-04-26 ENCOUNTER — Other Ambulatory Visit: Payer: Self-pay

## 2023-04-26 ENCOUNTER — Ambulatory Visit (HOSPITAL_COMMUNITY)
Admission: RE | Admit: 2023-04-26 | Discharge: 2023-04-26 | Disposition: A | Discharge status: Home / Self Care | Payer: BC Managed Care – PPO | Attending: Surgery | Admitting: Surgery

## 2023-04-26 ENCOUNTER — Encounter (HOSPITAL_COMMUNITY)
Admission: RE | Disposition: A | Discharge status: Home / Self Care | Payer: Self-pay | Source: Home / Self Care | Attending: Surgery

## 2023-04-26 ENCOUNTER — Ambulatory Visit (HOSPITAL_COMMUNITY): Payer: BC Managed Care – PPO

## 2023-04-26 ENCOUNTER — Encounter (HOSPITAL_COMMUNITY): Payer: Self-pay | Admitting: Surgery

## 2023-04-26 DIAGNOSIS — Z95828 Presence of other vascular implants and grafts: Secondary | ICD-10-CM | POA: Diagnosis not present

## 2023-04-26 DIAGNOSIS — Z9889 Other specified postprocedural states: Secondary | ICD-10-CM | POA: Diagnosis not present

## 2023-04-26 DIAGNOSIS — Y713 Surgical instruments, materials and cardiovascular devices (including sutures) associated with adverse incidents: Secondary | ICD-10-CM | POA: Insufficient documentation

## 2023-04-26 DIAGNOSIS — T84298A Other mechanical complication of internal fixation device of other bones, initial encounter: Secondary | ICD-10-CM | POA: Diagnosis present

## 2023-04-26 DIAGNOSIS — Z7901 Long term (current) use of anticoagulants: Secondary | ICD-10-CM | POA: Diagnosis not present

## 2023-04-26 DIAGNOSIS — R0789 Other chest pain: Secondary | ICD-10-CM

## 2023-04-26 DIAGNOSIS — T85692A Other mechanical complication of permanent sutures, initial encounter: Secondary | ICD-10-CM | POA: Diagnosis not present

## 2023-04-26 HISTORY — PX: STERNAL WIRES REMOVAL: SHX2441

## 2023-04-26 LAB — TYPE AND SCREEN
ABO/RH(D): A NEG
Antibody Screen: NEGATIVE

## 2023-04-26 LAB — COMPREHENSIVE METABOLIC PANEL
ALT: 18 U/L (ref 0–44)
AST: 28 U/L (ref 15–41)
Albumin: 4.1 g/dL (ref 3.5–5.0)
Alkaline Phosphatase: 42 U/L (ref 38–126)
Anion gap: 9 (ref 5–15)
BUN: 18 mg/dL (ref 6–20)
CO2: 23 mmol/L (ref 22–32)
Calcium: 9.2 mg/dL (ref 8.9–10.3)
Chloride: 106 mmol/L (ref 98–111)
Creatinine, Ser: 0.83 mg/dL (ref 0.61–1.24)
GFR, Estimated: >60 mL/min (ref >60)
Glucose, Bld: 86 mg/dL (ref 70–99)
Potassium: 4 mmol/L (ref 3.5–5.1)
Sodium: 138 mmol/L (ref 135–145)
Total Bilirubin: 1.1 mg/dL (ref 0.0–1.2)
Total Protein: 7.1 g/dL (ref 6.5–8.1)

## 2023-04-26 LAB — CBC
HCT: 44 % (ref 39.0–52.0)
Hemoglobin: 14.8 g/dL (ref 13.0–17.0)
MCH: 29.5 pg (ref 26.0–34.0)
MCHC: 33.6 g/dL (ref 30.0–36.0)
MCV: 87.8 fL (ref 80.0–100.0)
Platelets: 241 10*3/uL (ref 150–400)
RBC: 5.01 MIL/uL (ref 4.22–5.81)
RDW: 12.4 % (ref 11.5–15.5)
WBC: 8.6 10*3/uL (ref 4.0–10.5)
nRBC: 0 % (ref 0.0–0.2)

## 2023-04-26 LAB — PROTIME-INR
INR: 1.9 — ABNORMAL HIGH (ref 0.8–1.2)
Prothrombin Time: 21.9 s — ABNORMAL HIGH (ref 11.4–15.2)

## 2023-04-26 LAB — APTT: aPTT: 37 s — ABNORMAL HIGH (ref 24–36)

## 2023-04-26 SURGERY — REMOVAL, STERNAL WIRE
Anesthesia: General | Site: Chest

## 2023-04-26 MED ORDER — OXYCODONE HCL 5 MG PO TABS
5.0000 mg | ORAL_TABLET | ORAL | Status: AC | PRN
  Administered 2023-04-26: 5 mg via ORAL

## 2023-04-26 MED ORDER — ONDANSETRON HCL 4 MG/2ML IJ SOLN
INTRAMUSCULAR | Status: DC | PRN
  Administered 2023-04-26: 4 mg via INTRAVENOUS

## 2023-04-26 MED ORDER — CHLORHEXIDINE GLUCONATE 0.12 % MT SOLN
15.0000 mL | Freq: Once | OROMUCOSAL | Status: AC
  Administered 2023-04-26: 15 mL via OROMUCOSAL
  Filled 2023-04-26: qty 15

## 2023-04-26 MED ORDER — FENTANYL CITRATE (PF) 250 MCG/5ML IJ SOLN
INTRAMUSCULAR | Status: DC | PRN
  Administered 2023-04-26: 100 ug via INTRAVENOUS

## 2023-04-26 MED ORDER — ACETAMINOPHEN 10 MG/ML IV SOLN: 1000.0000 mg | Freq: Once | INTRAVENOUS | Status: DC | PRN

## 2023-04-26 MED ORDER — MIDAZOLAM HCL 2 MG/2ML IJ SOLN
INTRAMUSCULAR | Status: DC | PRN
  Administered 2023-04-26: 2 mg via INTRAVENOUS

## 2023-04-26 MED ORDER — PROPOFOL 10 MG/ML IV BOLUS
INTRAVENOUS | Status: DC | PRN
  Administered 2023-04-26: 140 mg via INTRAVENOUS

## 2023-04-26 MED ORDER — VANCOMYCIN HCL IN DEXTROSE 1-5 GM/200ML-% IV SOLN
1000.0000 mg | INTRAVENOUS | Status: AC
  Administered 2023-04-26: 1000 mg via INTRAVENOUS
  Filled 2023-04-26: qty 200

## 2023-04-26 MED ORDER — DEXAMETHASONE SODIUM PHOSPHATE 10 MG/ML IJ SOLN
INTRAMUSCULAR | Status: DC | PRN
  Administered 2023-04-26: 4 mg via INTRAVENOUS

## 2023-04-26 MED ORDER — OXYCODONE HCL 5 MG PO TABS
ORAL_TABLET | ORAL | Status: AC
  Filled 2023-04-26: qty 1

## 2023-04-26 MED ORDER — LIDOCAINE 2% (20 MG/ML) 5 ML SYRINGE
INTRAMUSCULAR | Status: DC | PRN
  Administered 2023-04-26: 60 mg via INTRAVENOUS

## 2023-04-26 MED ORDER — FENTANYL CITRATE (PF) 250 MCG/5ML IJ SOLN
INTRAMUSCULAR | Status: AC
  Filled 2023-04-26: qty 5

## 2023-04-26 MED ORDER — PROPOFOL 10 MG/ML IV BOLUS
INTRAVENOUS | Status: AC
  Filled 2023-04-26: qty 20

## 2023-04-26 MED ORDER — MIDAZOLAM HCL 2 MG/2ML IJ SOLN
INTRAMUSCULAR | Status: AC
  Filled 2023-04-26: qty 2

## 2023-04-26 MED ORDER — DIPHENHYDRAMINE HCL 50 MG/ML IJ SOLN
INTRAMUSCULAR | Status: DC | PRN
  Administered 2023-04-26: 25 mg via INTRAVENOUS

## 2023-04-26 MED ORDER — FENTANYL CITRATE (PF) 100 MCG/2ML IJ SOLN: 25.0000 ug | INTRAMUSCULAR | Status: DC | PRN

## 2023-04-26 MED ORDER — LACTATED RINGERS IV SOLN: INTRAVENOUS | Status: DC

## 2023-04-26 MED ORDER — ORAL CARE MOUTH RINSE: 15.0000 mL | Freq: Once | OROMUCOSAL | Status: AC

## 2023-04-26 MED ORDER — PHENYLEPHRINE 80 MCG/ML (10ML) SYRINGE FOR IV PUSH (FOR BLOOD PRESSURE SUPPORT)
PREFILLED_SYRINGE | INTRAVENOUS | Status: DC | PRN
  Administered 2023-04-26 (×2): 80 ug via INTRAVENOUS
  Administered 2023-04-26: 120 ug via INTRAVENOUS
  Administered 2023-04-26 (×4): 80 ug via INTRAVENOUS

## 2023-04-26 MED ORDER — 0.9 % SODIUM CHLORIDE (POUR BTL) OPTIME
TOPICAL | Status: DC | PRN
  Administered 2023-04-26: 2000 mL

## 2023-04-26 SURGICAL SUPPLY — 54 items
ATTRACTOMAT 16X20 MAGNETIC DRP (DRAPES) ×1 IMPLANT
BAG DECANTER FOR FLEXI CONT (MISCELLANEOUS) ×1 IMPLANT
BAND RUBBER #18 3X1/16 STRL (MISCELLANEOUS) IMPLANT
BLADE CLIPPER SURG (BLADE) ×1 IMPLANT
BLADE SURG 10 STRL SS (BLADE) ×2 IMPLANT
BNDG GAUZE DERMACEA FLUFF 4 (GAUZE/BANDAGES/DRESSINGS) IMPLANT
CANISTER SUCT 3000ML PPV (MISCELLANEOUS) ×1 IMPLANT
CATH THORACIC 28FR RT ANG (CATHETERS) IMPLANT
CATH THORACIC 36FR (CATHETERS) IMPLANT
CATH THORACIC 36FR RT ANG (CATHETERS) IMPLANT
CLIP TI WIDE RED SMALL 24 (CLIP) IMPLANT
CNTNR URN SCR LID CUP LEK RST (MISCELLANEOUS) IMPLANT
COVER SURGICAL LIGHT HANDLE (MISCELLANEOUS) ×2 IMPLANT
DERMABOND ADVANCED .7 DNX12 (GAUZE/BANDAGES/DRESSINGS) IMPLANT
DRAPE LAPAROSCOPIC ABDOMINAL (DRAPES) ×1 IMPLANT
DRAPE WARM FLUID 44X44 (DRAPES) IMPLANT
ELECT REM PT RETURN 9FT ADLT (ELECTROSURGICAL) ×1 IMPLANT
ELECTRODE REM PT RTRN 9FT ADLT (ELECTROSURGICAL) ×1 IMPLANT
GAUZE 4X4 16PLY ~~LOC~~+RFID DBL (SPONGE) ×1 IMPLANT
GAUZE SPONGE 4X4 12PLY STRL (GAUZE/BANDAGES/DRESSINGS) ×1 IMPLANT
GAUZE XEROFORM 5X9 LF (GAUZE/BANDAGES/DRESSINGS) IMPLANT
GLOVE BIOGEL PI IND STRL 7.0 (GLOVE) IMPLANT
GLOVE ECLIPSE 7.5 STRL STRAW (GLOVE) IMPLANT
GLOVE SURG MICRO LTX SZ7 (GLOVE) ×1 IMPLANT
GOWN STRL REUS W/ TWL LRG LVL3 (GOWN DISPOSABLE) ×4 IMPLANT
GOWN STRL REUS W/ TWL XL LVL3 (GOWN DISPOSABLE) ×1 IMPLANT
HEMOSTAT POWDER SURGIFOAM 1G (HEMOSTASIS) ×2 IMPLANT
KIT BASIN OR (CUSTOM PROCEDURE TRAY) ×1 IMPLANT
KIT SUCTION CATH 14FR (SUCTIONS) IMPLANT
KIT TURNOVER KIT B (KITS) ×1 IMPLANT
MARKER SKIN DUAL TIP RULER LAB (MISCELLANEOUS) IMPLANT
NS IRRIG 1000ML POUR BTL (IV SOLUTION) ×1 IMPLANT
PACK CHEST (CUSTOM PROCEDURE TRAY) ×1 IMPLANT
PAD ARMBOARD 7.5X6 YLW CONV (MISCELLANEOUS) ×2 IMPLANT
PIN SAFETY STERILE (MISCELLANEOUS) IMPLANT
SET HNDPC FAN SPRY TIP SCT (DISPOSABLE) IMPLANT
SOL PREP POV-IOD 4OZ 10% (MISCELLANEOUS) IMPLANT
SPONGE T-LAP 18X18 ~~LOC~~+RFID (SPONGE) ×5 IMPLANT
SPONGE T-LAP 4X18 ~~LOC~~+RFID (SPONGE) ×1 IMPLANT
SUT STEEL 6MS V (SUTURE) IMPLANT
SUT STEEL STERNAL CCS#1 18IN (SUTURE) IMPLANT
SUT STEEL SZ 6 DBL 3X14 BALL (SUTURE) IMPLANT
SUT VIC AB 1 CTX36XBRD ANBCTR (SUTURE) IMPLANT
SUT VIC AB 2-0 CTX 36 (SUTURE) IMPLANT
SUT VIC AB 3-0 SH 27X BRD (SUTURE) IMPLANT
SUT VIC AB 3-0 X1 27 (SUTURE) IMPLANT
SWAB COLLECTION DEVICE MRSA (MISCELLANEOUS) IMPLANT
SWAB CULTURE ESWAB REG 1ML (MISCELLANEOUS) IMPLANT
SYSTEM SAHARA CHEST DRAIN ATS (WOUND CARE) ×1 IMPLANT
TAPE CLOTH SURG 4X10 WHT LF (GAUZE/BANDAGES/DRESSINGS) IMPLANT
TOWEL GREEN STERILE (TOWEL DISPOSABLE) ×1 IMPLANT
TOWEL GREEN STERILE FF (TOWEL DISPOSABLE) ×1 IMPLANT
TRAY FOLEY MTR SLVR 14FR STAT (SET/KITS/TRAYS/PACK) ×1 IMPLANT
WATER STERILE IRR 1000ML POUR (IV SOLUTION) ×1 IMPLANT

## 2023-04-26 NOTE — Brief Op Note (Signed)
 04/26/2023  12:58 PM  PATIENT:  Larry Campos.  28 y.o. male  PRE-OPERATIVE DIAGNOSIS:  STERNAL PAIN  POST-OPERATIVE DIAGNOSIS:  STERNAL PAIN  PROCEDURE:  Procedure(s): STERNAL WIRES REMOVAL (N/A)  SURGEON:  Surgeons and Role:    * [name], MD - Primary  PHYSICIAN ASSISTANT: none  ASSISTANTS: none   ANESTHESIA:   general  EBL:  5 mL   BLOOD ADMINISTERED:none  DRAINS: none   LOCAL MEDICATIONS USED:  NONE  SPECIMEN:  No Specimen  DISPOSITION OF SPECIMEN:  N/A  COUNTS:  YES  TOURNIQUET:  * No tourniquets in log *  DICTATION: .Note written in EPIC  PLAN OF CARE: Discharge to home after PACU  PATIENT DISPOSITION:  PACU - hemodynamically stable.   Delay start of Pharmacological VTE agent (>24hrs) due to surgical blood loss or risk of bleeding: no

## 2023-04-26 NOTE — Op Note (Signed)
  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 In November 2024 he called our office reporting development of a painful swelling over the upper portion of his sternum. He sent [place] some pictures of it through MyChart. He was scheduled for a follow-up visit for 1 year CT scan to reevaluate the remainder of his aorta. He said that since the development of that swelling it has gradually resolved. He has had no fever or chills. There is no pain in the area.  I thought that most likely he was developing a localized hematoma due to local trauma over the superficial sternal wires while on Coumadin.  I felt the best option would be to remove the upper sternal wires to try to prevent further episodes of this.  I discussed the operative procedure with him and his wife including alternatives, benefits, and risks and he understood and agreed to proceed.   Preparation:  The patient was seen in the preoperative holding area and the correct patient, correct operation were confirmed with the patient. The consent was signed by me. Preoperative antibiotics were given.  The patient was taken back to the operating room and positioned supine on the operating room table. After being placed under general anesthesia by the anesthesia team the neck, chest, and abdomen were prepped with betadine soap and solution and draped in the usual  sterile manner. A surgical time-out was taken and the correct patient and operative procedure were confirmed with the nursing and anesthesia staff.    Removal of upper 3 sternal wires:  A 1 cm incision was made along the previous midline scar centered over the manubrium.  This was carried down through the subcutaneous tissue using electrocautery.  The 3 sternal wires in the manubrium were easily located.  Each was untwisted and removed without difficulty.  There were no fluid collections or signs of infection.  Hemostasis was complete.  The subcutaneous tissue was approximated with interrupted 3-0 Vicryl suture and the skin with 3-0 vicryl subcuticular suture. The sponge, needle, and instrument counts were correct according to the nurses. Dermabond was applied to the incision. The patient was awakened and transported to the PACU in stable condition.

## 2023-04-26 NOTE — Interval H&P Note (Signed)
 History and Physical Interval Note:  04/26/2023 11:09 AM  Larry Campos.  has presented today for surgery, with the diagnosis of STERNAL PAIN.  The various methods of treatment have been discussed with the patient and family. After consideration of risks, benefits and other options for treatment, the patient has consented to  Procedure(s): STERNAL WIRES REMOVAL (N/A) as a surgical intervention.  The patient's history has been reviewed, patient examined, no change in status, stable for surgery.  I have reviewed the patient's chart and labs.  Questions were answered to the patient's satisfaction.     [name]

## 2023-04-26 NOTE — Anesthesia Preprocedure Evaluation (Addendum)
 Anesthesia Evaluation  Patient identified by MRN, date of birth, ID band Patient awake    Reviewed: Allergy & Precautions, NPO status , Patient's Chart, lab work & pertinent test results  Airway Mallampati: II  TM Distance: >3 FB Neck ROM: Full    Dental no notable dental hx.    Pulmonary former smoker   Pulmonary exam normal        Cardiovascular  Rhythm:Regular Rate:Normal  S/p Bentall 2023    Neuro/Psych negative neurological ROS  negative psych ROS   GI/Hepatic negative GI ROS, Neg liver ROS,,,  Endo/Other  negative endocrine ROS    Renal/GU negative Renal ROS  negative genitourinary   Musculoskeletal negative musculoskeletal ROS (+)    Abdominal Normal abdominal exam  (+)   Peds  Hematology Lab Results      Component                Value               Date                      WBC                      11.9 (H)            02/05/2022                HGB                      10.9 (L)            02/05/2022                HCT                      34.6 (L)            02/05/2022                MCV                      88.3                02/05/2022                PLT                      409 (H)             02/05/2022              Anesthesia Other Findings   Reproductive/Obstetrics                              Anesthesia Physical Anesthesia Plan  ASA: 2  Anesthesia Plan: General   Post-op Pain Management:    Induction: Intravenous  PONV Risk Score and Plan: 2 and Ondansetron, Dexamethasone, Midazolam and Treatment may vary due to age or medical condition  Airway Management Planned: Mask and Oral ETT  Additional Equipment: None  Intra-op Plan:   Post-operative Plan: Extubation in OR  Informed Consent: I have reviewed the patients History and Physical, chart, labs and discussed the procedure including the risks, benefits and alternatives for the proposed anesthesia with the  patient or authorized representative who has indicated his/her understanding and acceptance.     Dental advisory given  Plan Discussed with: CRNA  Anesthesia Plan Comments:         Anesthesia Quick Evaluation

## 2023-04-26 NOTE — Discharge Instructions (Addendum)
 May resume normal activity  May shower

## 2023-04-26 NOTE — Anesthesia Postprocedure Evaluation (Signed)
 Anesthesia Post Note  Patient: Larry Campos.  Procedure(s) Performed: STERNAL WIRES REMOVAL (Chest)     Patient location during evaluation: PACU Anesthesia Type: General Level of consciousness: awake and alert Pain management: pain level controlled Vital Signs Assessment: post-procedure vital signs reviewed and stable Respiratory status: spontaneous breathing, nonlabored ventilation, respiratory function stable and patient connected to nasal cannula oxygen Cardiovascular status: blood pressure returned to baseline and stable Postop Assessment: no apparent nausea or vomiting Anesthetic complications: no   No notable events documented.  Last Vitals:  Vitals:   04/26/23 1400 04/26/23 1415  BP: 95/62 101/61  Pulse: 66 (!) 52  Resp: 15 11  Temp:  36.7 C  SpO2: 97% 98%    Last Pain:  Vitals:   04/26/23 1400  TempSrc:   PainSc: 6                  Airen Stiehl P Zaniya Mcaulay

## 2023-04-26 NOTE — Transfer of Care (Signed)
 Immediate Anesthesia Transfer of Care Note  Patient: Majid Mccravy.  Procedure(s) Performed: STERNAL WIRES REMOVAL (Chest)  Patient Location: PACU  Anesthesia Type:General  Level of Consciousness: drowsy  Airway & Oxygen Therapy: Patient Spontanous Breathing and Patient connected to nasal cannula oxygen  Post-op Assessment: Report given to RN and Post -op Vital signs reviewed and stable  Post vital signs: Reviewed and stable  Last Vitals:  Vitals Value Taken Time  BP 94/48 04/26/23 1301  Temp    Pulse 65 04/26/23 1302  Resp 11 04/26/23 1302  SpO2 96 % 04/26/23 1302  Vitals shown include unfiled device data.  Last Pain:  Vitals:   04/26/23 1051  TempSrc: Oral  PainSc: 0-No pain         Complications: No notable events documented.

## 2023-04-26 NOTE — H&P (Signed)
 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  He had an uncomplicated postoperative course but then returned with some left-sided pleuritic chest pain and pleural effusion consistent with Dressler syndrome.  He was treated with colchicine for 14 days and a prednisone taper with resolution.  Since then he has continued to do well and is on Coumadin with an INR of 2.1 on 01/08/2023.  In November he called our office reporting development of a painful swelling over the upper portion of his sternum.  He sent [place] some pictures of it through MyChart.  He was scheduled for a follow-up visit for 1 year CT scan to reevaluate the remainder of his aorta.  He said that since the development of that swelling it has gradually resolved.  He has had no fever or chills.  There is no pain in the area.         Current Outpatient Medications  Medication Sig Dispense Refill   aspirin EC 81 MG tablet Take 1 tablet (81 mg total) by mouth daily. Swallow whole. 30 tablet 12   azelastine (ASTELIN) 0.1 % nasal spray Place 2 sprays into both nostrils 2 (two) times daily. 30 mL 5   cetirizine (ZYRTEC) 10 MG tablet TAKE 1 TABLET BY MOUTH DAILY IN THE MORNING 30 tablet 5   fluticasone (FLONASE) 50 MCG/ACT nasal spray Place 2 sprays into both nostrils daily. 16 g 5   metoprolol tartrate (LOPRESSOR) 25 MG tablet Take 1 tablet (25 mg total) by mouth 2 (two) times daily. 180 tablet 1   montelukast (SINGULAIR) 10 MG tablet Take 1 tablet (10 mg total) by mouth at bedtime. 30 tablet 5   warfarin (COUMADIN) 2.5 MG tablet  Take 1 1/2 tablets daily or as directed by [facility] 50 tablet 5      No current facility-administered medications for this visit.          Physical Exam: BP 137/74   Pulse 67   Resp 18   Ht 5\' 9"  (1.753 m)   Wt 150 lb (68 kg)   SpO2 99% Comment: RA  BMI 22.15 kg/m  He looks well. Cardiac exam shows a regular rate and rhythm with a crisp mechanical valve click.  There is no murmur. The chest incision is well-healed.  There is no swelling along the incision but I cannot feel the sternal wires particularly over the manubrium.  When he turns his head I can feel some underlying soft tissue moving over the wire.  The sternum feels stable.   Diagnostic Tests:   I have personally reviewed the CT scan and the sternum is well-healed.  The sternal wires do protrude because he has very little subcutaneous tissue.  There is no sign of subcutaneous swelling or mass.  The ascending aortic graft looks normal.  There is no sign of pseudoaneurysm formation at any of the anastomoses.  The remainder of his aortic arch and descending thoracic aorta are normal size.   Impression:   I think he most likely  developed a subcutaneous hematoma related to local trauma from soft tissue moving over the sternal wire.  This is probably more likely to occur since he is on Coumadin and has very little soft tissue there.  It has completely resolved but certainly may occur again.  I think the best option is to remove the upper sternal wires to prevent this from happening again and to prevent any discomfort with turning his head.  I reviewed the CT images with him and answered all of his questions. I discussed the alternatives, benefits and risks with him and he agrees to proceed.   Plan:   Removal of sternal wires.     [name], MD [facility] [phone]

## 2023-04-26 NOTE — Anesthesia Procedure Notes (Signed)
 Procedure Name: LMA Insertion Date/Time: 04/26/2023 12:03 PM  Performed by: Yolonda Kida, CRNAPre-anesthesia Checklist: Patient identified, Emergency Drugs available, Suction available and Patient being monitored Patient Re-evaluated:Patient Re-evaluated prior to induction Oxygen Delivery Method: Circle System Utilized Preoxygenation: Pre-oxygenation with 100% oxygen Induction Type: IV induction Ventilation: Mask ventilation without difficulty LMA: LMA inserted LMA Size: 4.0 Number of attempts: 1 Airway Equipment and Method: Bite block Placement Confirmation: positive ETCO2 Tube secured with: Tape Dental Injury: Teeth and Oropharynx as per pre-operative assessment

## 2023-04-27 ENCOUNTER — Encounter (HOSPITAL_COMMUNITY): Payer: Self-pay | Admitting: Surgery

## 2023-05-27 ENCOUNTER — Ambulatory Visit: Payer: BC Managed Care – PPO | Attending: Cardiology | Admitting: *Deleted

## 2023-05-27 DIAGNOSIS — Z5181 Encounter for therapeutic drug level monitoring: Secondary | ICD-10-CM

## 2023-05-27 DIAGNOSIS — Z952 Presence of prosthetic heart valve: Secondary | ICD-10-CM

## 2023-05-27 LAB — POCT INR: INR: 2 (ref 2.0–3.0)

## 2023-05-27 NOTE — Patient Instructions (Signed)
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 (04/17/22) Continue warfarin 1 1/2 tablets daily except 1 tablet on Tuesdays and Fridays Recheck in 6 wks

## 2023-07-08 ENCOUNTER — Ambulatory Visit: Attending: Cardiology

## 2023-07-16 ENCOUNTER — Ambulatory Visit: Attending: Cardiology | Admitting: *Deleted

## 2023-07-16 DIAGNOSIS — Z5181 Encounter for therapeutic drug level monitoring: Secondary | ICD-10-CM | POA: Diagnosis not present

## 2023-07-16 DIAGNOSIS — Z952 Presence of prosthetic heart valve: Secondary | ICD-10-CM | POA: Diagnosis not present

## 2023-07-16 LAB — POCT INR: INR: 1.7 — AB (ref 2.0–3.0)

## 2023-07-16 NOTE — Patient Instructions (Signed)
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 (04/17/22) Continue warfarin 1 1/2 tablets daily except 1 tablet on Tuesdays and Fridays Recheck in 6 wks

## 2023-09-02 ENCOUNTER — Ambulatory Visit: Attending: Cardiology | Admitting: *Deleted

## 2023-09-02 DIAGNOSIS — Z5181 Encounter for therapeutic drug level monitoring: Secondary | ICD-10-CM | POA: Diagnosis not present

## 2023-09-02 DIAGNOSIS — Z952 Presence of prosthetic heart valve: Secondary | ICD-10-CM

## 2023-09-02 LAB — POCT INR: INR: 1.9 — AB (ref 2.0–3.0)

## 2023-09-02 NOTE — Progress Notes (Signed)
Please see anticoagulation encounter.

## 2023-09-02 NOTE — Patient Instructions (Signed)
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 (04/17/22) Continue warfarin 1 1/2 tablets daily except 1 tablet on Tuesdays and Fridays Recheck in 6 wks

## 2023-09-03 ENCOUNTER — Encounter

## 2023-09-25 ENCOUNTER — Encounter: Payer: Self-pay | Admitting: Cardiology

## 2023-09-25 ENCOUNTER — Ambulatory Visit: Attending: Cardiology | Admitting: Cardiology

## 2023-09-25 VITALS — BP 119/75 | HR 65 | Ht 69.0 in | Wt 150.0 lb

## 2023-09-25 DIAGNOSIS — Z7901 Long term (current) use of anticoagulants: Secondary | ICD-10-CM

## 2023-09-25 DIAGNOSIS — I350 Nonrheumatic aortic (valve) stenosis: Secondary | ICD-10-CM

## 2023-09-25 DIAGNOSIS — Z952 Presence of prosthetic heart valve: Secondary | ICD-10-CM

## 2023-09-25 NOTE — Patient Instructions (Addendum)
 Medication Instructions:   Reduce Metoprolol   to 1/2 tablet twice a day for 2 days and then stop.  Labwork: None today  Testing/Procedures: None today  Follow-Up: 1 year  Any Other Special Instructions Will Be Listed Below (If Applicable).  If you need a refill on your cardiac medications before your next appointment, please call your pharmacy.

## 2023-09-25 NOTE — Progress Notes (Signed)
    Cardiology Office Note  Date: 09/25/2023   ID: Larry Campos., DOB 1995-10-24, MRN [id number]  History of Present Illness: Bracy Pepper. is a 28 y.o. male last seen in June 2024.  He is here for a follow-up visit.  States that he is doing very well, no functional limitations or exertional symptoms. He underwent removal of upper 3 sternal wires from manubrium under LMA with Dr. Lucas in February.  He has no further sternal discomfort.  He remains on Coumadin  with follow-up in the anticoagulation clinic.  Reports no spontaneous bleeding problems.  He has been on low-dose Lopressor , we discussed weaning off this medication.  No baseline hypertension or rhythm changes.  I reviewed his lab work from March.  I reviewed his ECG today which shows sinus arrhythmia with possible left atrial enlargement.  Physical Exam: VS:  BP 119/75 (BP Location: Left Arm, Patient Position: Sitting, Cuff Size: Normal)   Pulse 65   Ht 5' 9 (1.753 m)   Wt 150 lb (68 kg)   SpO2 98%   BMI 22.15 kg/m , BMI Body mass index is 22.15 kg/m.  Wt Readings from Last 3 Encounters:  09/25/23 150 lb (68 kg)  04/26/23 145 lb (65.8 kg)  02/28/23 150 lb (68 kg)    General: Patient appears comfortable at rest. HEENT: Conjunctiva and lids normal. Neck: Supple, no elevated JVP or carotid bruits. Lungs: Clear to auscultation, nonlabored breathing at rest. Cardiac: Regular rate and rhythm, no S3, 2/6 systolic murmur with crisp mechanical click in S2, no pericardial rub.  ECG:  An ECG dated 02/05/2022 was personally reviewed today and demonstrated:  Sinus rhythm with repolarization abnormalities.  Labwork: 04/26/2023: ALT 18; AST 28; BUN 18; Creatinine, Ser 0.83; Hemoglobin 14.8; Platelets 241; Potassium 4.0; Sodium 138   Other Studies Reviewed Today:  No interval cardiac testing for review today.  Assessment and Plan:  History of bicuspid aortic valve with severe aortic regurgitation and moderate  aortic stenosis and thoracic aortic aneurysm status post Bentall procedure with 27/29 mm On-X mechanical valve conduit and reimplantation of coronaries with 26 mm Hemashield Platinum ascending aortic graft in November 2023.  Follow-up echocardiogram in December 2023 showed normal prosthetic function with normal gradients.  He is doing quite well with no functional limitations or exertional symptoms.  ECG shows sinus rhythm.  Plan to wean off Lopressor  at this point and observe.  No baseline hypertension, but can track blood pressure over time.  No indication for repeat valvular imaging at this time.  Keep follow-up in the anticoagulation clinic on Coumadin .  Disposition:  Follow up 1 year.  Signed, Jayson JUDITHANN Sierras, M.D., F.A.C.C. [facility] at [facility]

## 2023-10-06 IMAGING — DX DG CHEST 2V
2 series · 2 of 2 positions shown · non-contrast
Comparison: None Available.

CLINICAL DATA: Chest pain.  Syncopal episode today

EXAM:
CHEST - 2 VIEW

[chest pa]
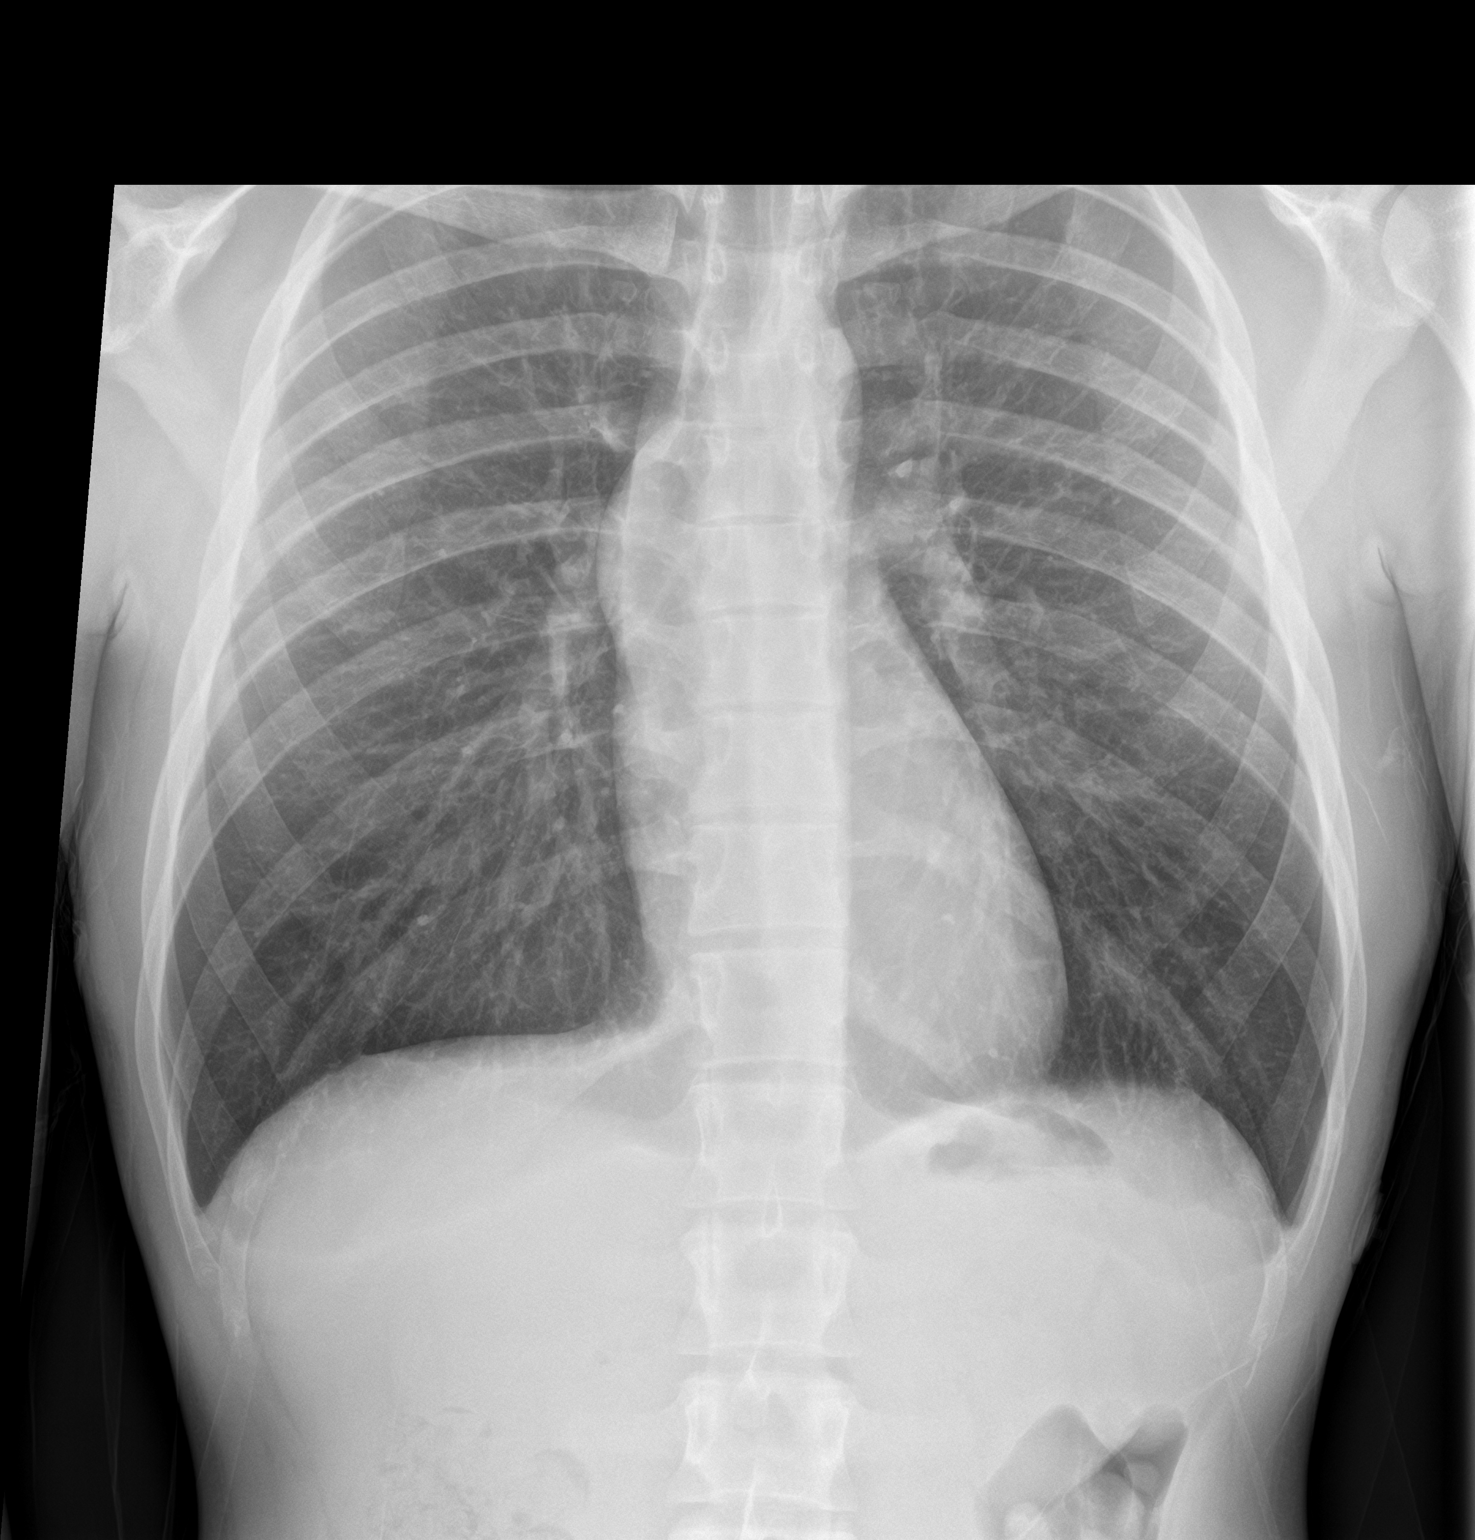

[chest lat]
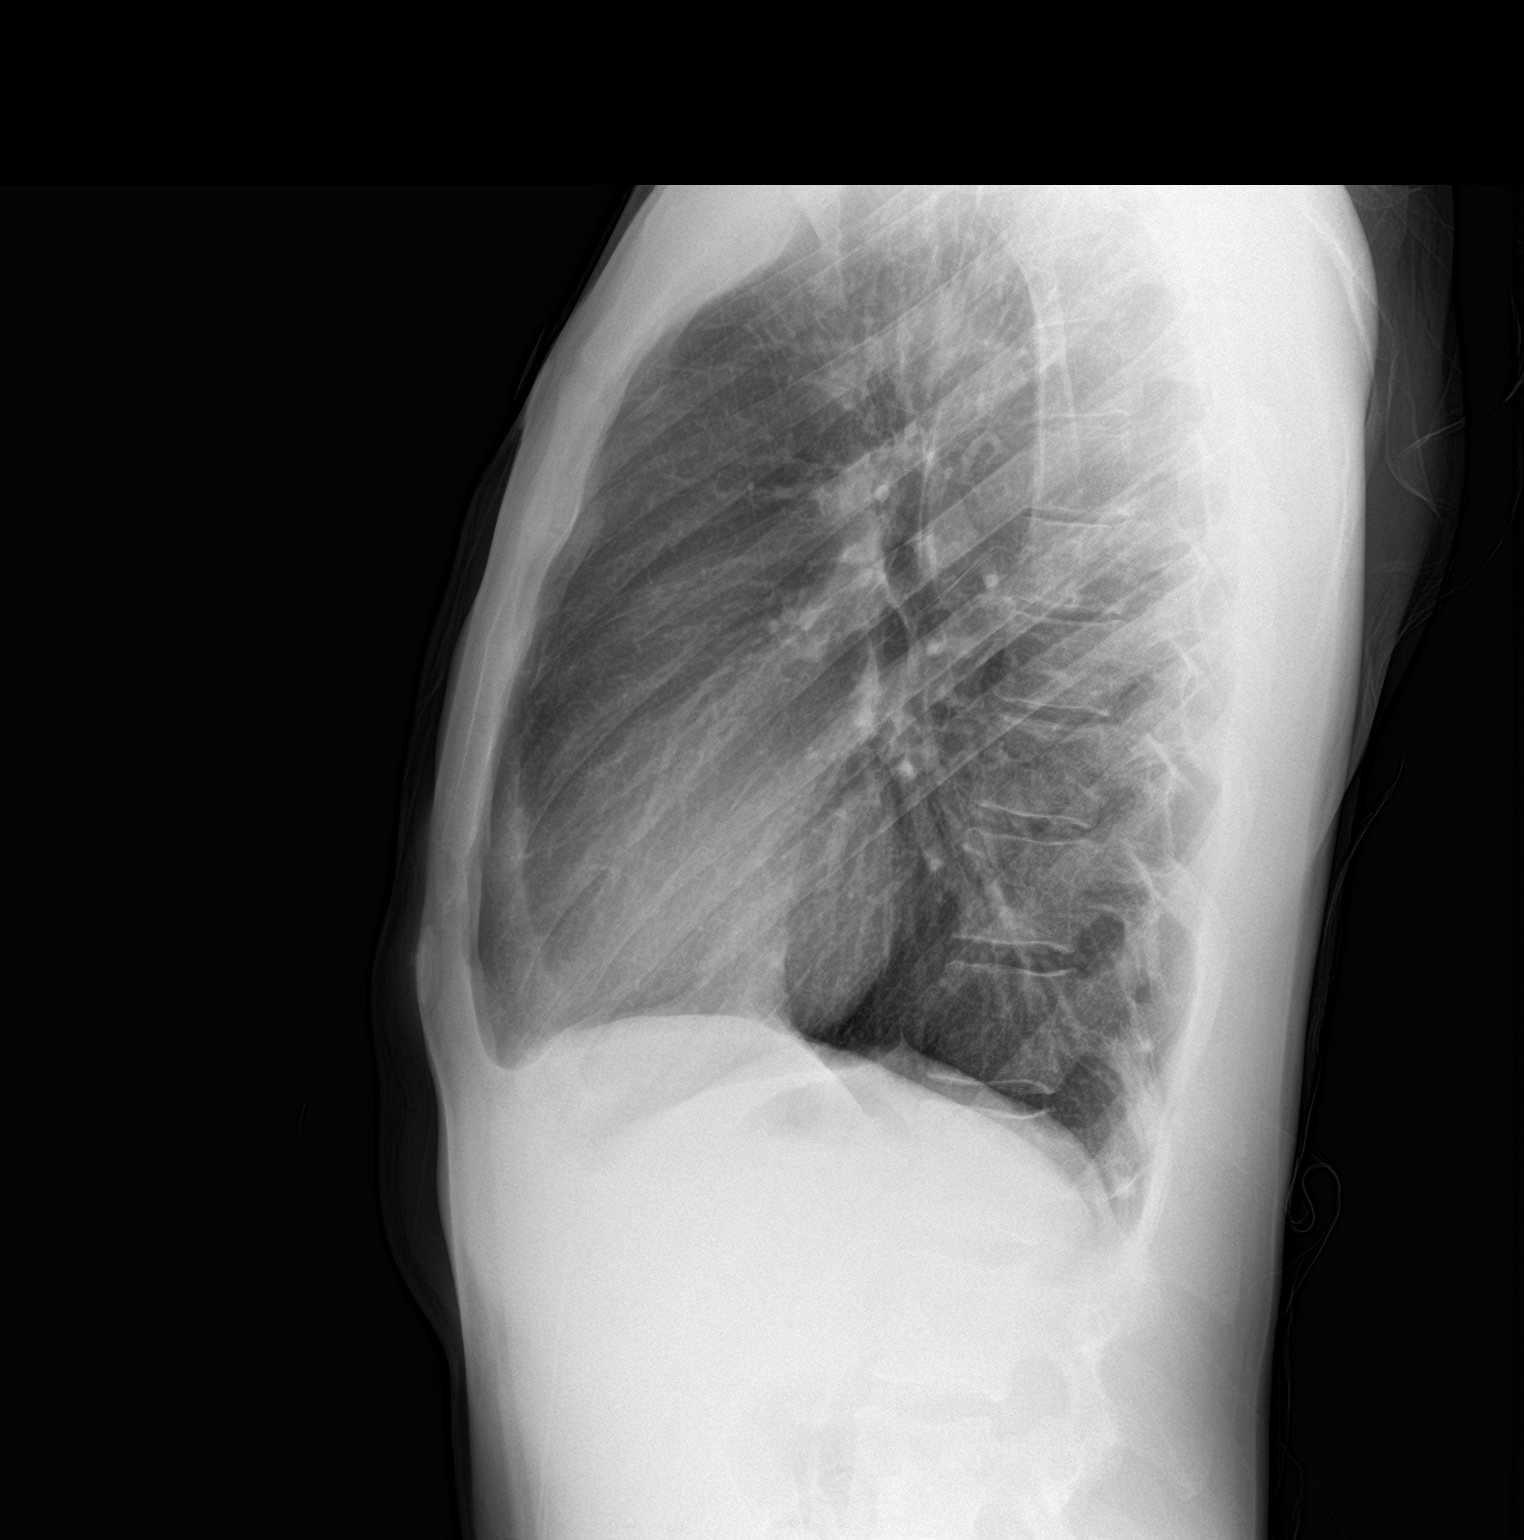

[2 of 2 positions shown; findings below may reference images not displayed]

FINDINGS: The cardiomediastinal contours are normal. The lungs are clear.
Pulmonary vasculature is normal. No consolidation, pleural effusion,
or pneumothorax. No acute osseous abnormalities are seen.
IMPRESSION: Negative radiographs of the chest.

## 2023-10-09 ENCOUNTER — Ambulatory Visit: Attending: Cardiology | Admitting: *Deleted

## 2023-10-09 DIAGNOSIS — Z5181 Encounter for therapeutic drug level monitoring: Secondary | ICD-10-CM | POA: Diagnosis not present

## 2023-10-09 DIAGNOSIS — Z952 Presence of prosthetic heart valve: Secondary | ICD-10-CM | POA: Diagnosis not present

## 2023-10-09 LAB — POCT INR: INR: 2.2 (ref 2.0–3.0)

## 2023-10-09 NOTE — Progress Notes (Signed)
 INR 2.2; Please see anticoagulation encounter

## 2023-10-09 NOTE — Patient Instructions (Signed)
 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 (04/17/22) Take warfarin 1 tablet tonight then resume 1 1/2 tablets daily except 1 tablet on Tuesdays and Fridays Recheck in 6 wks

## 2023-10-10 ENCOUNTER — Other Ambulatory Visit: Payer: Self-pay | Admitting: Family

## 2023-11-08 ENCOUNTER — Other Ambulatory Visit: Payer: Self-pay | Admitting: Family

## 2023-11-20 ENCOUNTER — Encounter

## 2023-12-02 ENCOUNTER — Ambulatory Visit: Payer: Self-pay | Attending: Cardiology | Admitting: *Deleted

## 2023-12-02 DIAGNOSIS — Z952 Presence of prosthetic heart valve: Secondary | ICD-10-CM

## 2023-12-02 DIAGNOSIS — Z5181 Encounter for therapeutic drug level monitoring: Secondary | ICD-10-CM

## 2023-12-02 LAB — POCT INR: INR: 1.8 — AB (ref 2.0–3.0)

## 2023-12-02 NOTE — Progress Notes (Signed)
 INR 1.8. Please see anticoagulation encounter

## 2023-12-02 NOTE — Patient Instructions (Signed)
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 (04/17/22) Continue warfarin 1 1/2 tablets daily except 1 tablet on Tuesdays and Fridays Recheck in 6 wks

## 2023-12-12 ENCOUNTER — Other Ambulatory Visit: Payer: Self-pay | Admitting: Cardiology

## 2024-01-13 ENCOUNTER — Ambulatory Visit: Attending: Cardiology | Admitting: *Deleted

## 2024-01-13 DIAGNOSIS — Z952 Presence of prosthetic heart valve: Secondary | ICD-10-CM | POA: Diagnosis not present

## 2024-01-13 DIAGNOSIS — Z5181 Encounter for therapeutic drug level monitoring: Secondary | ICD-10-CM

## 2024-01-13 LAB — POCT INR: INR: 2 (ref 2.0–3.0)

## 2024-01-13 NOTE — Patient Instructions (Signed)
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 (04/17/22) Continue warfarin 1 1/2 tablets daily except 1 tablet on Tuesdays and Fridays Recheck in 6 wks

## 2024-01-13 NOTE — Progress Notes (Signed)
 INR 2.0 Please see anticoagulation encounter.

## 2024-02-24 ENCOUNTER — Ambulatory Visit: Attending: Cardiology | Admitting: *Deleted

## 2024-02-24 DIAGNOSIS — Z952 Presence of prosthetic heart valve: Secondary | ICD-10-CM

## 2024-02-24 DIAGNOSIS — Z5181 Encounter for therapeutic drug level monitoring: Secondary | ICD-10-CM | POA: Diagnosis not present

## 2024-02-24 LAB — POCT INR: INR: 1.5 — AB (ref 2.0–3.0)

## 2024-02-24 NOTE — Patient Instructions (Signed)
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 (04/17/22) Continue warfarin 1 1/2 tablets daily except 1 tablet on Tuesdays and Fridays Recheck in 6 wks

## 2024-02-24 NOTE — Progress Notes (Signed)
 INR-1.5; Please see anticoagulation encounter

## 2024-04-06 ENCOUNTER — Ambulatory Visit
